# Patient Record
Sex: Female | Born: 1985 | Race: White | Hispanic: No | Marital: Married | Smoking: Never smoker
Health system: Southern US, Community
[De-identification: ages and names within clinical notes are randomized; demographics above are authoritative.]

## PROBLEM LIST (undated history)

## (undated) DIAGNOSIS — D649 Anemia, unspecified: Secondary | ICD-10-CM

## (undated) DIAGNOSIS — R112 Nausea with vomiting, unspecified: Secondary | ICD-10-CM

## (undated) DIAGNOSIS — Z9889 Other specified postprocedural states: Secondary | ICD-10-CM

## (undated) DIAGNOSIS — F419 Anxiety disorder, unspecified: Secondary | ICD-10-CM

## (undated) DIAGNOSIS — E66811 Obesity, class 1: Secondary | ICD-10-CM

## (undated) DIAGNOSIS — F32A Depression, unspecified: Secondary | ICD-10-CM

## (undated) DIAGNOSIS — T8859XA Other complications of anesthesia, initial encounter: Secondary | ICD-10-CM

## (undated) HISTORY — DX: Anxiety disorder, unspecified: F41.9

## (undated) HISTORY — PX: CHOLECYSTECTOMY: SHX55

---

## 2012-10-01 HISTORY — PX: PILONIDAL CYST EXCISION: SHX744

## 2014-10-01 HISTORY — PX: CHOLECYSTECTOMY: SHX55

## 2016-06-26 ENCOUNTER — Encounter: Payer: Self-pay | Admitting: Emergency Medicine

## 2016-06-26 ENCOUNTER — Emergency Department: Payer: BLUE CROSS/BLUE SHIELD

## 2016-06-26 ENCOUNTER — Emergency Department
Admission: EM | Admit: 2016-06-26 | Discharge: 2016-06-27 | Disposition: A | Discharge status: Home / Self Care | Payer: BLUE CROSS/BLUE SHIELD | Attending: Emergency Medicine | Admitting: Emergency Medicine

## 2016-06-26 DIAGNOSIS — R51 Headache: Secondary | ICD-10-CM | POA: Diagnosis present

## 2016-06-26 DIAGNOSIS — R110 Nausea: Secondary | ICD-10-CM

## 2016-06-26 DIAGNOSIS — G43009 Migraine without aura, not intractable, without status migrainosus: Secondary | ICD-10-CM | POA: Diagnosis not present

## 2016-06-26 DIAGNOSIS — Z7982 Long term (current) use of aspirin: Secondary | ICD-10-CM | POA: Diagnosis not present

## 2016-06-26 LAB — CBC
HEMATOCRIT: 38.4 % (ref 35.0–47.0)
HEMOGLOBIN: 13.8 g/dL (ref 12.0–16.0)
MCH: 33.1 pg (ref 26.0–34.0)
MCHC: 35.9 g/dL (ref 32.0–36.0)
MCV: 92.1 fL (ref 80.0–100.0)
PLATELETS: 196 10*3/uL (ref 150–440)
RBC: 4.17 MIL/uL (ref 3.80–5.20)
RDW: 12.1 % (ref 11.5–14.5)
WBC: 6.8 10*3/uL (ref 3.6–11.0)

## 2016-06-26 LAB — BASIC METABOLIC PANEL
Anion gap: 6 (ref 5–15)
BUN: 16 mg/dL (ref 6–20)
CHLORIDE: 104 mmol/L (ref 101–111)
CO2: 26 mmol/L (ref 22–32)
Calcium: 8.8 mg/dL — ABNORMAL LOW (ref 8.9–10.3)
Creatinine, Ser: 0.92 mg/dL (ref 0.44–1.00)
GFR calc Af Amer: >60 mL/min (ref >60)
GFR calc non Af Amer: >60 mL/min (ref >60)
GLUCOSE: 92 mg/dL (ref 65–99)
POTASSIUM: 3.8 mmol/L (ref 3.5–5.1)
Sodium: 136 mmol/L (ref 135–145)

## 2016-06-26 LAB — POCT PREGNANCY, URINE: Preg Test, Ur: NEGATIVE

## 2016-06-26 MED ORDER — IBUPROFEN 800 MG PO TABS: 800.0000 mg | ORAL_TABLET | Freq: Three times a day (TID) | ORAL | 0 refills | Status: DC | PRN

## 2016-06-26 MED ORDER — SODIUM CHLORIDE 0.9 % IV BOLUS (SEPSIS)
1000.0000 mL | Freq: Once | INTRAVENOUS | Status: AC
Start: 2016-06-26 — End: 2016-06-27
  Administered 2016-06-26: 1000 mL via INTRAVENOUS
  Filled 2016-06-26: qty 1000

## 2016-06-26 MED ORDER — KETOROLAC TROMETHAMINE 30 MG/ML IJ SOLN
30.0000 mg | Freq: Once | INTRAMUSCULAR | Status: AC
  Administered 2016-06-26: 30 mg via INTRAVENOUS
  Filled 2016-06-26: qty 1

## 2016-06-26 MED ORDER — METOCLOPRAMIDE HCL 10 MG PO TABS: 10.0000 mg | ORAL_TABLET | Freq: Three times a day (TID) | ORAL | 0 refills | Status: DC | PRN

## 2016-06-26 MED ORDER — METOCLOPRAMIDE HCL 5 MG/ML IJ SOLN
10.0000 mg | Freq: Once | INTRAMUSCULAR | Status: AC
  Administered 2016-06-26: 10 mg via INTRAVENOUS
  Filled 2016-06-26: qty 2

## 2016-06-26 MED ORDER — HYDROMORPHONE HCL 1 MG/ML IJ SOLN
0.5000 mg | Freq: Once | INTRAMUSCULAR | Status: AC
  Administered 2016-06-26: 0.5 mg via INTRAVENOUS
  Filled 2016-06-26: qty 1

## 2016-06-26 NOTE — ED Triage Notes (Signed)
Patient ambulatory to triage with steady gait, without difficulty or distress noted; pt reports frontal migraine; denies hx of as same;  seen at urgent care today and dx with ?meningitis; denies any recent illness

## 2016-06-26 NOTE — ED Provider Notes (Signed)
[facility] Provider Note  ____________________________________________  Time seen: Approximately 9:56 PM  I have reviewed the triage vital signs and the nursing notes.   HISTORY  Chief Complaint Migraine    HPI Christine Banks is a 30 y.o. female presenting with headache. The patient reports that for the last week she's had a progressively worsening headache. She describes that when she wakes up in the morning, she has an all over pressure sensation with associated nausea, decreased appetite, but no vomiting. She takes Advil or Excedrin Migraine, and her symptoms improved for several hours but then resumed. The patient has had "sweating" but no documented fever. She denies any recent trauma, tick bites, vomiting, rash, visual or speech changes, numbness tingling or weakness, travel outside the [place], difficulty walking.  Tonight she was seen at urgent care with concern for possible meningitis so was sent here for further evaluation.  FH: Positive for migraines. Negative for brain mass or aneurysms.  History reviewed. No pertinent past medical history.  There are no active problems to display for this patient.   Past Surgical History:  Procedure Laterality Date  . CHOLECYSTECTOMY      Current Outpatient Rx  . Order #: [id number] Class: Historical Med  . Order #: [id number] Class: Print  . Order #: [id number] Class: Print    Allergies Review of patient's allergies indicates no known allergies.  No family history on file.  Social History Social History  Substance Use Topics  . Smoking status: Never Smoker  . Smokeless tobacco: Never Used  . Alcohol use No    Review of Systems Constitutional: Sweating without documented fever. No lightheadedness or syncope. Eyes: No visual changes. No blurred or double vision. ENT: No sore throat. No congestion or rhinorrhea. No dental pain. Cardiovascular: Denies chest pain. Denies  palpitations. Respiratory: Denies shortness of breath.  No cough. Gastrointestinal: No abdominal pain.  Positive nausea, no vomiting.  No diarrhea.  No constipation. Positive decreased appetite. Genitourinary: Negative for dysuria. Musculoskeletal: Negative for back pain. Skin: Negative for rash. Neurological: Positive for headaches. No focal numbness, tingling or weakness. No difficulty walking. No changes in speech or vision.  10-point ROS otherwise negative.  ____________________________________________   PHYSICAL EXAM:  VITAL SIGNS: ED Triage Vitals  Enc Vitals Group     BP 06/26/16 2125 (!) 129/99     Pulse Rate 06/26/16 2125 92     Resp 06/26/16 2125 20     Temp 06/26/16 2125 98.3 F (36.8 C)     Temp Source 06/26/16 2125 Oral     SpO2 06/26/16 2125 100 %     Weight 06/26/16 2122 203 lb (92.1 kg)     Height 06/26/16 2122 5\' 6"  (1.676 m)     Head Circumference --      Peak Flow --      Pain Score 06/26/16 2122 9     Pain Loc --      Pain Edu? --      Excl. in GC? --     Constitutional: Alert and oriented. Mildly uncomfortable appearing but nontoxic. Answers questions appropriately. Eyes: Conjunctivae are normal.  EOMI. PERRLA. No photophobia. No scleral icterus. Head: Atraumatic. No pain over the temples bilaterally Nose: No congestion/rhinnorhea. Mouth/Throat: Mucous membranes are moist.  Neck: No stridor.  Supple.  No JVD. No meningismus. Full range of motion without pain. Cardiovascular: Normal rate, regular rhythm. No murmurs, rubs or gallops.  Respiratory: Normal respiratory effort.  No accessory muscle use or retractions.  Lungs CTAB.  No wheezes, rales or ronchi. Gastrointestinal: Soft, nontender and nondistended.  No guarding or rebound.  No peritoneal signs. Musculoskeletal: No LE edema. . Neurologic:  A&Ox3.  Speech is clear.  Face and smile are symmetric.  EOMI. PERRLA. Moves all extremities well. Skin:  Skin is warm, dry and intact. No rash  noted. Psychiatric: Mood and affect are normal. Speech and behavior are normal.  Normal judgement.  ____________________________________________   LABS (all labs ordered are listed, but only abnormal results are displayed)  Labs Reviewed  BASIC METABOLIC PANEL - Abnormal; Notable for the following:       Result Value   Calcium 8.8 (*)    All other components within normal limits  CBC  POC URINE PREG, ED  POCT PREGNANCY, URINE   ____________________________________________  EKG  Not indicated ____________________________________________  RADIOLOGY  No results found.  ____________________________________________   PROCEDURES  Procedure(s) performed: None  Procedures  Critical Care performed: No ____________________________________________   INITIAL IMPRESSION / ASSESSMENT AND PLAN / ED COURSE  Pertinent labs & imaging results that were available during my care of the patient were reviewed by me and considered in my medical decision making (see chart for details).  30 y.o. female with 1 week of headache with associated nausea but no other neurologic symptoms, no documented fever. Overall, the patient is well-appearing and nontoxic. While viral meningitis is possible, bacterial meningitis is very unlikely given the patient's length of symptoms, and her reassuring examination. He is also possible that she has a migraine, or dehydration. We will check her for pregnancy or UTI. Evaluate for electrolyte abnormalities. Venous sinus thrombosis is also an unlikely diagnosis in this patient, although it is possible given her age. We will start by getting a CT scan, basic labs, and initiated symptomatically treatment. I've had a discussion with the patient about lumbar puncture to evaluate for meningitis. At this time she is deferring additional intervention regarding the lumbar puncture, but we will reassess this after her initial diagnostic and therapeutic treatment is  initiated.   ----------------------------------------- 11:19 PM on 06/26/2016 -----------------------------------------  The patient's labs are reassuring, and her white blood cell count is normal. At this time, the patient states that her pain is virtually gone. She has no nausea. She is requesting food. She remains afebrile and non-meningitic on exam, so will plan to defer LP per the patient requested this time. Plan discharge. The patient understands return precautions as well as follow-up instructions. ____________________________________________  FINAL CLINICAL IMPRESSION(S) / ED DIAGNOSES  Final diagnoses:  Migraine without aura and without status migrainosus, not intractable  Nausea without vomiting    Clinical Course      NEW MEDICATIONS STARTED DURING THIS VISIT:  New Prescriptions   IBUPROFEN (ADVIL,MOTRIN) 800 MG TABLET    Take 1 tablet (800 mg total) by mouth every 8 (eight) hours as needed for headache (with food).   METOCLOPRAMIDE (REGLAN) 10 MG TABLET    Take 1 tablet (10 mg total) by mouth every 8 (eight) hours as needed for nausea or vomiting.      Rockne MenghiniAnne-Caroline Daemion Mcniel, MD 06/26/16 [phone]

## 2016-06-26 NOTE — ED Notes (Signed)
MD at bedside to discuss plan of care. Pt asking for something to eat.

## 2016-06-26 NOTE — ED Notes (Signed)
POC urine preg NEGATIVE. 

## 2016-06-26 NOTE — Discharge Instructions (Signed)
Please drink plenty of fluid to stay well-hydrated. If you begin to develop a headache, please take medications early in the course of your headache as it is easier to treat a headache early on and when it is full-blown.  Return to the emergency department if you develop severe pain, fever, numbness tingling or weakness, rash, visual changes, or any other symptoms concerning to you.

## 2016-06-27 NOTE — ED Notes (Signed)
Pt given crackers and peanut butter per her request. Pt alert and calm at this time with family at the bedside. Lights out to enhance rest. Will continue to assess.

## 2016-06-27 NOTE — ED Provider Notes (Signed)
-----------------------------------------   2:39 AM on 06/27/2016 -----------------------------------------   Blood pressure 110/62, pulse 73, temperature 98.3 F (36.8 C), temperature source Oral, resp. rate 16, height 5\' 6"  (1.676 m), weight 203 lb (92.1 kg), last menstrual period 06/04/2016, SpO2 97 %.  Assuming care from Dr. Sharma CovertNorman.  In short, Christine Banks is a 30 y.o. female with a chief complaint of Migraine .  Refer to the original H&P for additional details.  The current plan of care is to follow up the CT head results.   CT head: No acute intracranial pathology.  The patient will be discharged to home to follow-up with her primary care physician.   Christine ApleyAllison P Lyssa Hackley, MD 06/27/16 [phone]

## 2017-05-04 IMAGING — CT CT HEAD W/O CM
3 series · 16 of 44 positions shown, 19 images · non-contrast
Comparison: None.

CLINICAL DATA: 30-year-old female with headache

EXAM:
CT HEAD WITHOUT CONTRAST
TECHNIQUE: Contiguous axial images were obtained from the base of the skull
through the vertex without intravenous contrast.

[Series 3: head wo · axial · 0.42mm/px · z∈[-96,+14]mm · 10 of 27 slices shown, 13 images]
[im 3/27  brain]
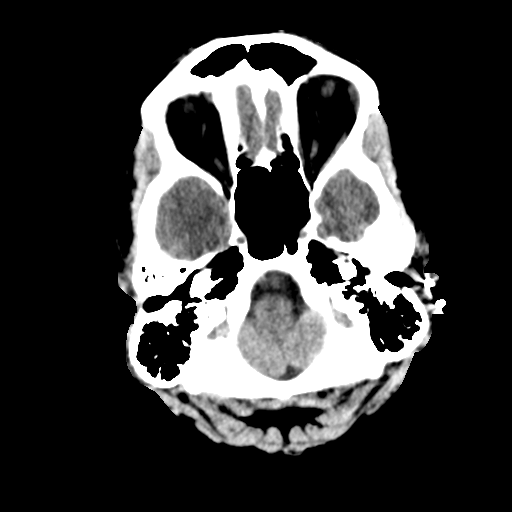
[im 3/27  bone]
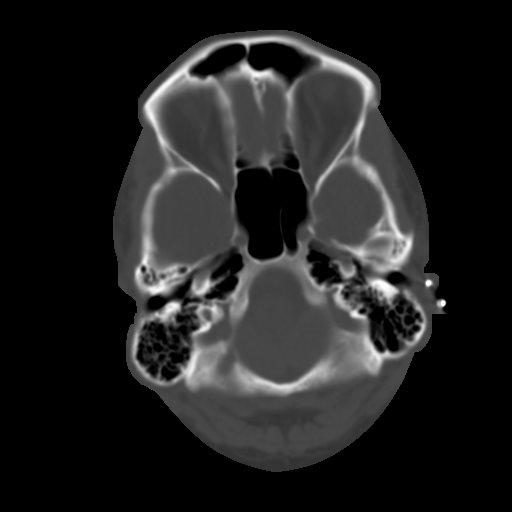
[im 5/27  brain]
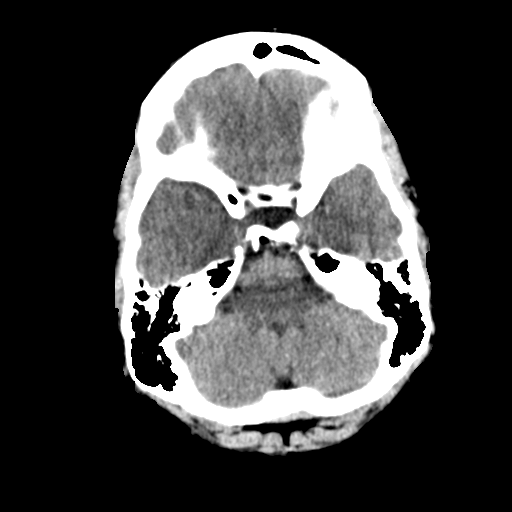
[im 8/27  brain]
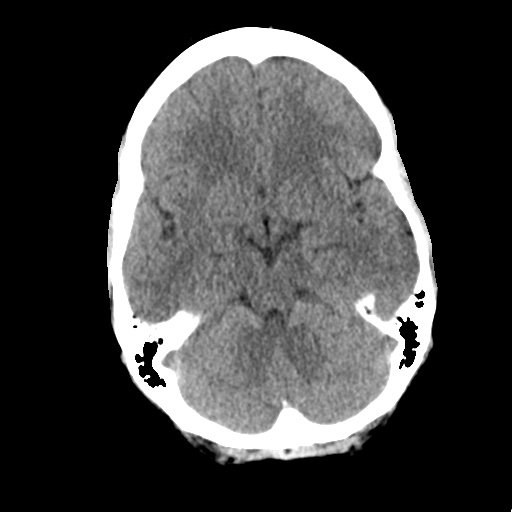
[im 10/27  brain]
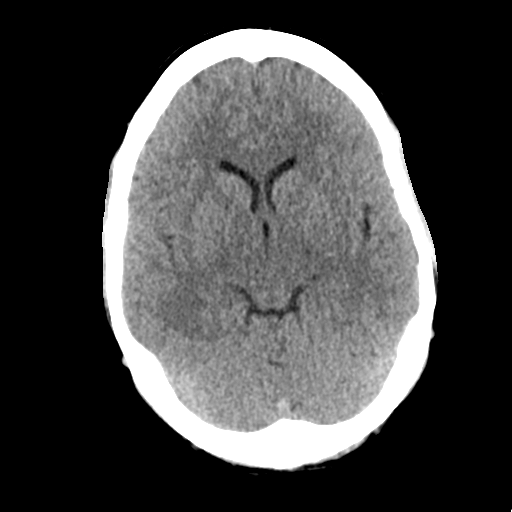
[im 13/27  brain]
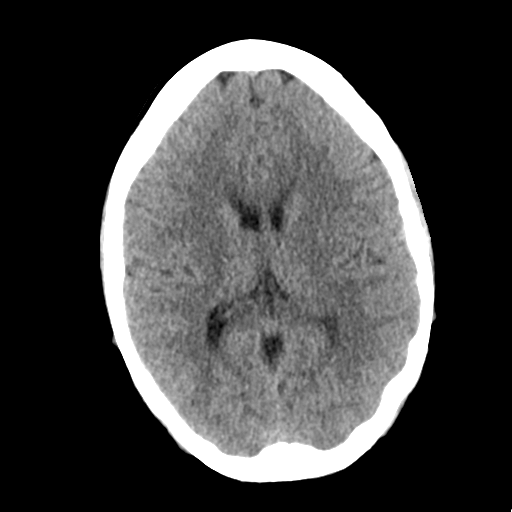
[im 13/27  bone]
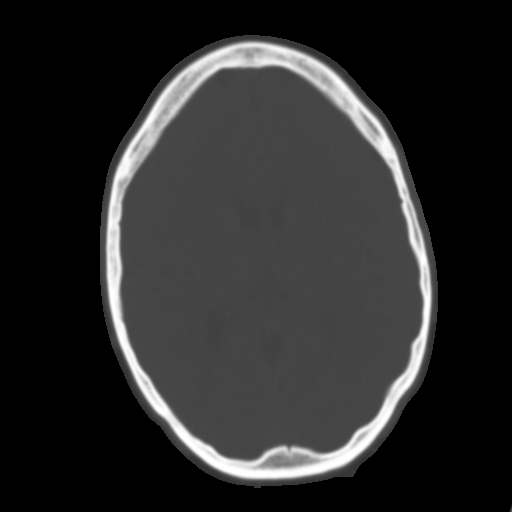
[im 15/27  brain]
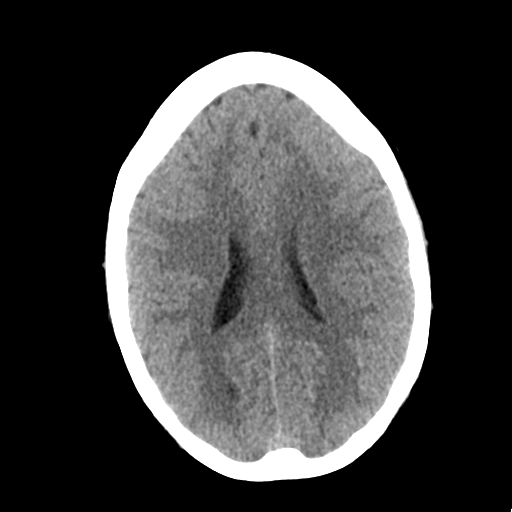
[im 18/27  brain]
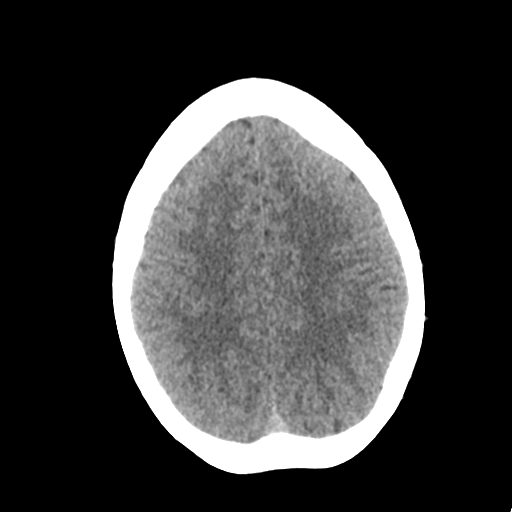
[im 20/27  brain]
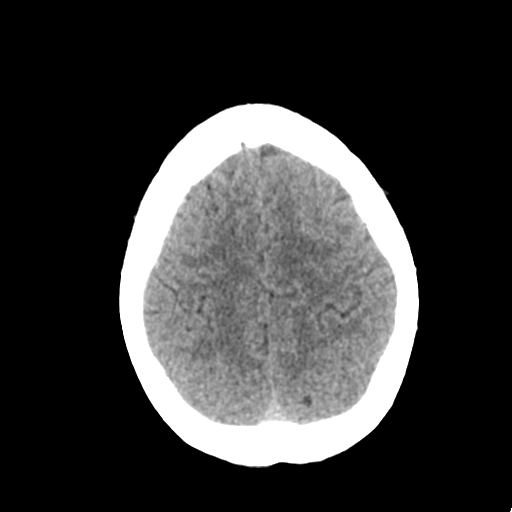
[im 23/27  brain]
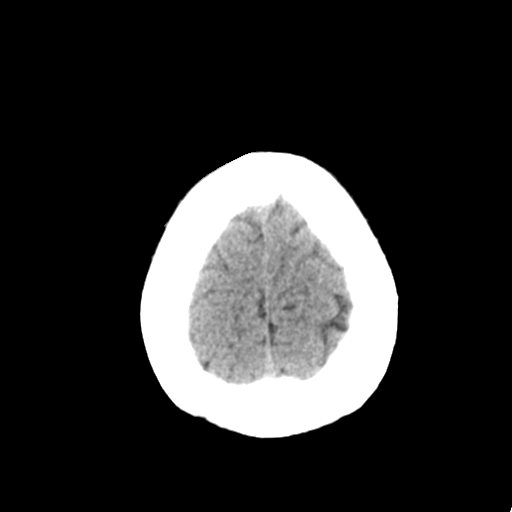
[im 23/27  bone]
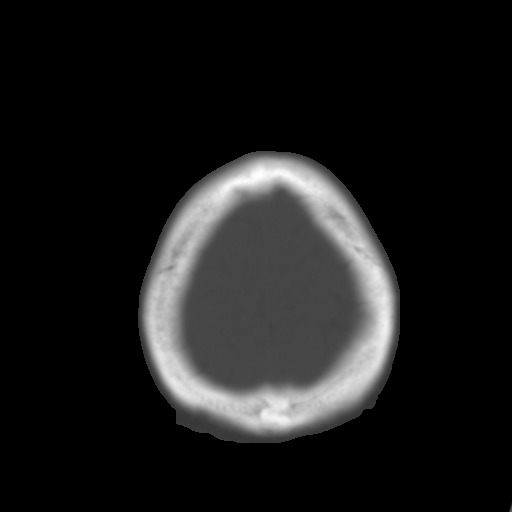
[im 25/27  brain]
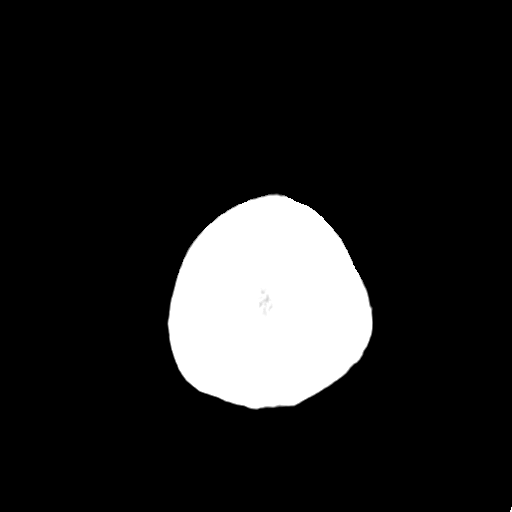

[Series 4: coronal soft tissue · coronal · 0.28mm/px · 3 of 63 slices shown]
[im 21/63  brain]
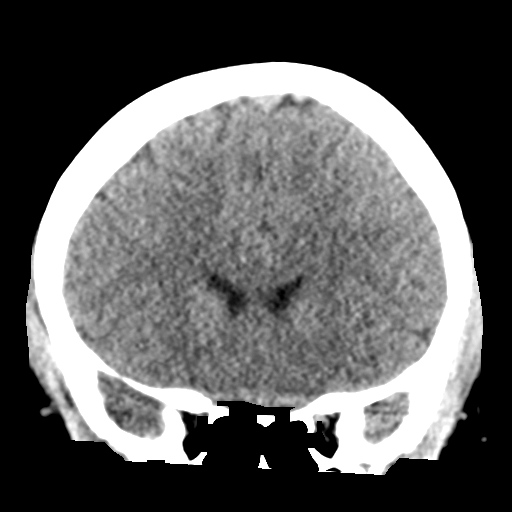
[im 28/63  brain]
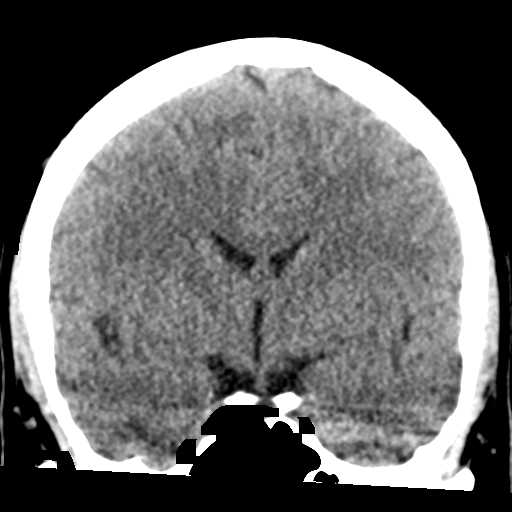
[im 35/63  brain]
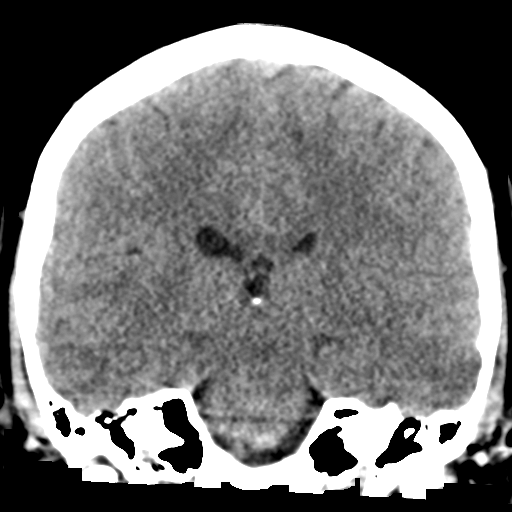

[Series 5: sagittal soft tissue · sagittal · 0.28mm/px · 3 of 46 slices shown]
[im 16/46  brain]
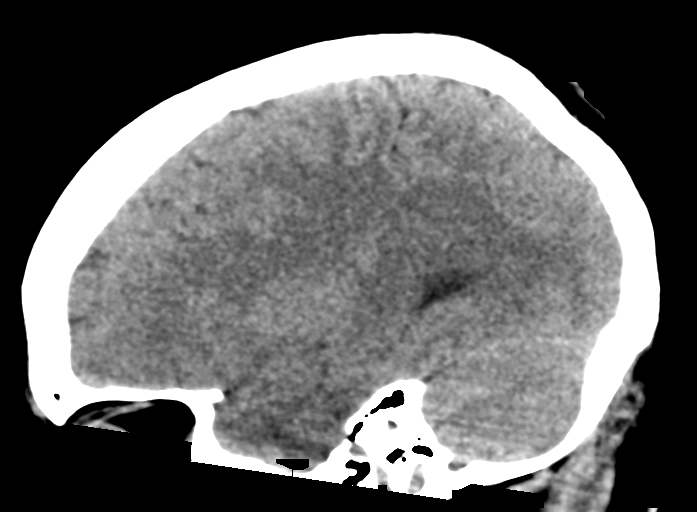
[im 23/46  brain]
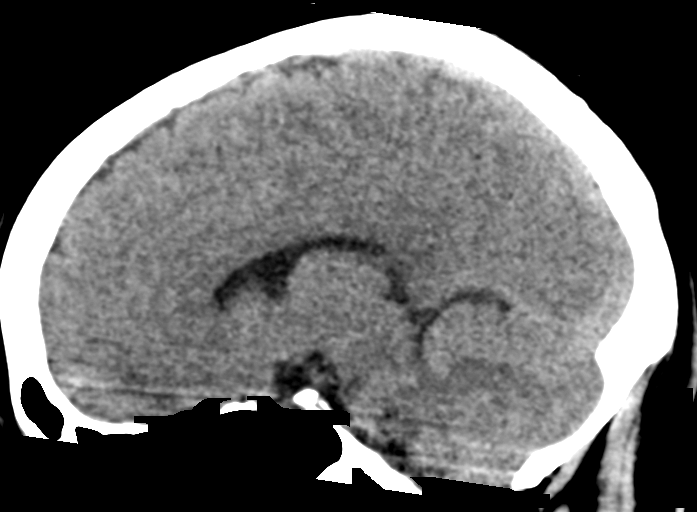
[im 31/46  brain]
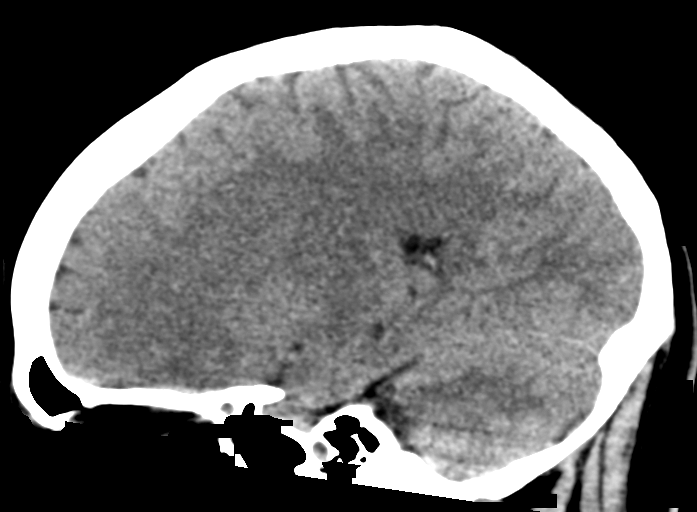

[16 of 44 positions shown; findings below may reference images not displayed]

FINDINGS: Brain: No evidence of acute infarction, hemorrhage, hydrocephalus,
extra-axial collection or mass lesion/mass effect.

Vascular: No hyperdense vessel or unexpected calcification.

Skull: Normal. Negative for fracture or focal lesion.

Sinuses/Orbits: No acute finding.

Other: None
IMPRESSION: No acute intracranial pathology.

## 2019-07-05 ENCOUNTER — Emergency Department: Payer: 59

## 2019-07-05 ENCOUNTER — Emergency Department
Admission: EM | Admit: 2019-07-05 | Discharge: 2019-07-05 | Disposition: A | Discharge status: Home / Self Care | Payer: 59 | Attending: Emergency Medicine | Admitting: Emergency Medicine

## 2019-07-05 DIAGNOSIS — S161XXA Strain of muscle, fascia and tendon at neck level, initial encounter: Secondary | ICD-10-CM | POA: Insufficient documentation

## 2019-07-05 MED ORDER — IBUPROFEN 600 MG PO TABS
600.0000 mg | ORAL_TABLET | Freq: Three times a day (TID) | ORAL | 0 refills | Status: DC | PRN
Start: 2019-07-05 — End: 2019-07-05

## 2019-07-05 MED ORDER — IBUPROFEN 600 MG PO TABS
600.00 mg | ORAL_TABLET | Freq: Once | ORAL | Status: AC
Start: 2019-07-05 — End: 2019-07-05
  Administered 2019-07-05: 18:00:00 600 mg via ORAL

## 2019-07-05 MED ORDER — IBUPROFEN 600 MG PO TABS
ORAL_TABLET | ORAL | Status: AC
Start: 2019-07-05 — End: ?
  Filled 2019-07-05: qty 1

## 2019-07-05 MED ORDER — IBUPROFEN 600 MG PO TABS
600.00 mg | ORAL_TABLET | Freq: Three times a day (TID) | ORAL | 0 refills | Status: AC | PRN
Start: 2019-07-05 — End: ?
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
## 2019-07-05 NOTE — Discharge Instructions (Signed)
Neck Sprain or Strain  A sudden force that causes turning or bending of the neck can cause sprain or strain. An example would be the force from a car accident. This can stretch or tear muscles called a strain. It can also stretch or tear ligaments called a sprain. Either of these can cause neck pain. Sometimes neck pain occurs after a simple awkward movement. In either case, muscle spasm is commonly present and contributes to the pain.  Unless you had a forceful physical injury (for example, a car accident or fall), X-rays are often not ordered for the initial evaluation of neck pain. If pain continues and does not respond to medical treatment, X-rays and other tests may be done later.  Home care   You may feel more soreness and spasm the first few days after the injury. Rest until symptoms start to improve.   When lying down, use a comfortable pillow or a rolled towel that supports the head and keeps the spine in a neutral position. The position of the head should not be tilted forward or backward.   Apply an ice pack over the injured area for 15 to 20 minutes every 3 to 6 hours. Do this for the first 24 to 48 hours. You can make an ice pack by filling a plastic bag that seals at the top with ice cubes and then wrapping it with a thin towel. After 48 hours, apply heat (warm shower or warm bath) for 15 to 20 minutes several times a day, or alternate ice and heat.   You may use over-the-counter pain medicine to control pain, unless another pain medicine was prescribed. If you have chronic liver or kidney disease or ever had a stomach ulcer or gastrointestinal bleeding, talk with your healthcare provider before using these medicines.   If a soft cervical collar was prescribed, only ear it for periods of increased pain. It should not be worn for more than 3 hours a day, or for longer than 1 to 2 weeks.  Follow-up care  Follow up with your healthcare provider, or as directed. Physical therapy may be  needed.  Sometimes fractures don't show up on the first X-ray. Bruises and sprains can sometimes hurt as much as a fracture. These injuries can take time to heal completely. If your symptoms don't improve or they get worse, talk with your healthcare provider. You may need a repeat X-ray or other tests. If X-rays were taken, you will be told of any new findings that may affect your care.  Call 911  Call 911 if you have:   Neck swelling, difficulty or painful swallowing   Trouble breathing   Chest pain  When to seek medical advice  Call your healthcare provider right away if any of these occur:   Pain becomes worse or spreads into your arms or legs   Weakness or numbness in one or both arms or legs  [employer] last reviewed this educational content on 01/29/2017   2000-2020 [employer] [address], [place] [address]. All rights reserved. This information is not intended as a substitute for professional medical care. Always follow your healthcare professional's instructions.        Neck Sprain or Strain  A sudden force that causes turning or bending of the neck can cause sprain or strain. An example would be the force from a car accident. This can stretch or tear muscles called a strain. It can also stretch or tear ligaments called   a sprain. Either of these can cause neck pain. Sometimes neck pain occurs after a simple awkward movement. In either case, muscle spasm is commonly present and contributes to the pain.  Unless you had a forceful physical injury (for example, a car accident or fall), X-rays are often not ordered for the initial evaluation of neck pain. If pain continues and does not respond to medical treatment, X-rays and other tests may be done later.  Home care   You may feel more soreness and spasm the first few days after the injury. Rest until symptoms start to improve.   When lying down, use a comfortable pillow or a rolled towel that supports the head and keeps the spine  in a neutral position. The position of the head should not be tilted forward or backward.   Apply an ice pack over the injured area for 15 to 20 minutes every 3 to 6 hours. Do this for the first 24 to 48 hours. You can make an ice pack by filling a plastic bag that seals at the top with ice cubes and then wrapping it with a thin towel. After 48 hours, apply heat (warm shower or warm bath) for 15 to 20 minutes several times a day, or alternate ice and heat.   You may use over-the-counter pain medicine to control pain, unless another pain medicine was prescribed. If you have chronic liver or kidney disease or ever had a stomach ulcer or gastrointestinal bleeding, talk with your healthcare provider before using these medicines.   If a soft cervical collar was prescribed, only ear it for periods of increased pain. It should not be worn for more than 3 hours a day, or for longer than 1 to 2 weeks.  Follow-up care  Follow up with your healthcare provider, or as directed. Physical therapy may be needed.  Sometimes fractures don't show up on the first X-ray. Bruises and sprains can sometimes hurt as much as a fracture. These injuries can take time to heal completely. If your symptoms don't improve or they get worse, talk with your healthcare provider. You may need a repeat X-ray or other tests. If X-rays were taken, you will be told of any new findings that may affect your care.  Call 911  Call 911 if you have:   Neck swelling, difficulty or painful swallowing   Trouble breathing   Chest pain  When to seek medical advice  Call your healthcare provider right away if any of these occur:   Pain becomes worse or spreads into your arms or legs   Weakness or numbness in one or both arms or legs  [employer] last reviewed this educational content on 01/29/2017   2000-2020 [employer] [address], [place] [address]. All rights reserved. This information is not intended as a substitute for professional  medical care. Always follow your healthcare professional's instructions.

## 2020-07-30 ENCOUNTER — Telehealth: Payer: BLUE CROSS/BLUE SHIELD | Admitting: Emergency Medicine

## 2020-07-30 DIAGNOSIS — J029 Acute pharyngitis, unspecified: Secondary | ICD-10-CM

## 2020-07-30 MED ORDER — CLINDAMYCIN HCL 300 MG PO CAPS: 300.0000 mg | ORAL_CAPSULE | Freq: Three times a day (TID) | ORAL | 0 refills | Status: AC

## 2020-07-30 NOTE — Progress Notes (Signed)
Time spent: 10 min  We are sorry that you are not feeling well.  Here is how we plan to help!  Based on what you have shared with me it is likely that you have pharyngitis.  Pharyngitis is inflammation and infection in the back of the throat.  Pharyngitis can be due to a virus (most common) or a bacteria (strep throat being most common).  Based on your symptoms your infection may be caused by bacteria and is treated with antibiotics.  I have prescribed Clindamycin 300 mg three times a day for 10 days. For throat pain, we recommend over the counter oral pain relief medications such as acetaminophen or ibuprofen or naproxen sodium. Topical treatments such as oral throat lozenges or sprays may be used as needed. Soft diet like mashed potatoes, smoothies will help prevent more irritation in the throat.  Warm water and salt gargles are very helpful in decreasing inflammation.  Strep infections are not as easily transmitted as other respiratory infections, however we still recommend that you avoid close contact with loved ones, especially the very young and elderly.  Remember to wash your hands thoroughly throughout the day as this is the number one way to prevent the spread of infection and wipe down door knobs and counters with disinfectant.   Home Care:  Only take medications as instructed by your medical team.  Complete the entire course of an antibiotic.  Do not take these medications with alcohol.  A steam or ultrasonic humidifier can help congestion.  You can place a towel over your head and breathe in the steam from hot water coming from a faucet.  Avoid close contacts especially the very young and the elderly.  Cover your mouth when you cough or sneeze.  Always remember to wash your hands.  Get Help Right Away If:  You develop worsening fever or sinus pain.  You develop a severe head ache or visual changes.  Your symptoms persist after you have completed your treatment plan.  Make  sure you  Understand these instructions.  Will watch your condition.  Will get help right away if you are not doing well or get worse.  Your e-visit answers were reviewed by a board certified advanced clinical practitioner to complete your personal care plan.  Depending on the condition, your plan could have included both over the counter or prescription medications.  If there is a problem please reply  once you have received a response from your provider.  Your safety is important to [place].  If you have drug allergies check your prescription carefully.    You can use MyChart to ask questions about today's visit, request a non-urgent call back, or ask for a work or school excuse for 24 hours related to this e-Visit. If it has been greater than 24 hours you will need to follow up with your provider, or enter a new e-Visit to address those concerns.  You will get an e-mail in the next two days asking about your experience.  I hope that your e-visit has been valuable and will speed your recovery. Thank you for using e-visits.

## 2022-02-12 NOTE — Progress Notes (Signed)
I,Donnielle Addison Robinson,acting as a Neurosurgeon for [employer], PA-C.,have documented all relevant documentation on the behalf of Debera Lat, PA-C,as directed by  [employer], PA-C while in the presence of [employer], PA-C.  New Patient Office Visit  Subjective    Patient ID: Christine Banks, female    DOB: 02/28/1986  Age: 36 y.o. MRN: [id number]  CC:  Chief Complaint  Patient presents with   Sinus Problem   Establish Care  CC: sore throat   HPI Christine Banks presents to establish care sore throat.    Upper respiratory symptoms She complains of congestion, cough described as productive, headache described as frontal, throbbing, nasal congestion, post nasal drip, sinus pressure, sneezing, and sore throat.with night sweats. Onset of symptoms was about a week ago and staying constant.She is drinking plenty of fluids.  Past history is significant for no history of pneumonia or bronchitis. Patient is non-smoker. Taking Mucinex daytime and nighttime with mild, temporary relief and helps patient sleep. One in the morning and one at night, occasional Tylenol.    ---------------------------------------------------------------------------------------------------  Outpatient Encounter Medications as of 02/13/2022  Medication Sig   aspirin-acetaminophen-caffeine (EXCEDRIN MIGRAINE) 250-250-65 MG tablet Take 2 tablets by mouth once. (Patient not taking: Reported on 02/13/2022)   ibuprofen (ADVIL,MOTRIN) 800 MG tablet Take 1 tablet (800 mg total) by mouth every 8 (eight) hours as needed for headache (with food). (Patient not taking: Reported on 02/13/2022)   metoCLOPramide (REGLAN) 10 MG tablet Take 1 tablet (10 mg total) by mouth every 8 (eight) hours as needed for nausea or vomiting.   No facility-administered encounter medications on file as of 02/13/2022.    Past Medical History:  Diagnosis Date   Anxiety     Past Surgical History:  Procedure Laterality Date   CHOLECYSTECTOMY      Family  History  Problem Relation Age of Onset   Hyperthyroidism Mother    Arthritis Mother    Arthritis Father    Hypertension Father    Lung cancer Maternal Grandmother    Cancer - Lung Maternal Grandfather    Lung cancer Paternal Grandfather     Social History   Socioeconomic History   Marital status: Married    Spouse name: Therapist, sports   Number of children: Not on file   Years of education: Not on file   Highest education level: Not on file  Occupational History   Not on file  Tobacco Use   Smoking status: Never   Smokeless tobacco: Never  Substance and Sexual Activity   Alcohol use: Yes   Drug use: Never   Sexual activity: Yes    Birth control/protection: Other-see comments, None    Comment: same sex marriage  Other Topics Concern   Not on file  Social History Narrative   Not on file   Social Determinants of Health   Financial Resource Strain: Not on file  Food Insecurity: Not on file  Transportation Needs: Not on file  Physical Activity: Not on file  Stress: Not on file  Social Connections: Not on file  Intimate Partner Violence: Not on file    Review of Systems  Constitutional:  Positive for malaise/fatigue.  Musculoskeletal:  Positive for neck pain.  Psychiatric/Behavioral:  The patient is nervous/anxious.   All other systems reviewed and are negative.      Objective    BP 132/90 (BP Location: Right Arm, Patient Position: Sitting, Cuff Size: Normal)   Pulse 78   Temp 98.2 F (36.8 C) (Oral)  Resp 16   Wt 199 lb (90.3 kg)   SpO2 100%   BMI 32.12 kg/m   Physical Exam Vitals reviewed.  Constitutional:      General: She is not in acute distress.    Appearance: Normal appearance. She is well-developed. She is not diaphoretic.  HENT:     Head: Normocephalic and atraumatic.     Right Ear: There is impacted cerumen.     Left Ear: There is impacted cerumen.     Nose: Nose normal.     Mouth/Throat:     Mouth: Mucous membranes are moist.     Pharynx: No  oropharyngeal exudate.  Eyes:     General: No scleral icterus.    Extraocular Movements: Extraocular movements intact.     Conjunctiva/sclera: Conjunctivae normal.     Pupils: Pupils are equal, round, and reactive to light.  Neck:     Thyroid: No thyromegaly.  Cardiovascular:     Rate and Rhythm: Normal rate and regular rhythm.     Pulses: Normal pulses.     Heart sounds: Normal heart sounds. No murmur heard. Pulmonary:     Effort: Pulmonary effort is normal. No respiratory distress.     Breath sounds: Normal breath sounds. No wheezing or rales.  Abdominal:     General: There is no distension.     Palpations: Abdomen is soft.     Tenderness: There is no abdominal tenderness.  Musculoskeletal:        General: No deformity.     Cervical back: Neck supple.     Right lower leg: No edema.     Left lower leg: No edema.  Lymphadenopathy:     Cervical: No cervical adenopathy.  Skin:    General: Skin is warm.     Findings: No rash.  Neurological:     Mental Status: She is alert and oriented to person, place, and time. Mental status is at baseline.     Sensory: No sensory deficit.     Motor: No weakness.     Gait: Gait normal.  Psychiatric:        Mood and Affect: Mood normal.        Behavior: Behavior normal.        Thought Content: Thought content normal.        Judgment: Judgment normal.        Assessment & Plan:   Problem List Items Addressed This Visit   None  1. Acute non-recurrent frontal sinusitis  - POCT rapid strep A neg - POCT Influenza A/B neg  2. Encounter for medical examination to establish care  3. Obesity (BMI 30.0-34.9) BMI 32 Initial workup Last labs 6 years ago - Comprehensive metabolic panel - Lipid panel - TSH  4. Screening for thyroid disorder - TSH  5. Nervous/anxious - will screen for anxiety/depression  6. Cerumen impaction - will remove in office or refer to ENT  7. Health maintenance - needs pap smear, mammogram? - Pt was  asked to bring immunization records to her next appt  CPE as scheduled with PAP smear if she agrees,   The patient was advised to call back or seek an in-person evaluation if the symptoms worsen or if the condition fails to improve as anticipated.  I discussed the assessment and treatment plan with the patient. The patient was provided an opportunity to ask questions and all were answered. The patient agreed with the plan and demonstrated an understanding of the instructions.  The entirety  of the information documented in the History of Present Illness, Review of Systems and Physical Exam were personally obtained by me. Portions of this information were initially documented by the CMA and reviewed by me for thoroughness and accuracy.  Portions of this note were created using dictation software and may contain typographical errors.   Sarina IllJana Robinson, CMA

## 2022-02-13 ENCOUNTER — Ambulatory Visit: Payer: Commercial Managed Care - PPO | Admitting: Physician Assistant

## 2022-02-13 ENCOUNTER — Encounter: Payer: Self-pay | Admitting: Physician Assistant

## 2022-02-13 VITALS — BP 132/90 | HR 78 | Temp 98.2°F | Resp 16 | Wt 199.0 lb

## 2022-02-13 DIAGNOSIS — E669 Obesity, unspecified: Secondary | ICD-10-CM | POA: Diagnosis not present

## 2022-02-13 DIAGNOSIS — J0100 Acute maxillary sinusitis, unspecified: Secondary | ICD-10-CM | POA: Diagnosis not present

## 2022-02-13 DIAGNOSIS — Z1329 Encounter for screening for other suspected endocrine disorder: Secondary | ICD-10-CM

## 2022-02-13 DIAGNOSIS — Z0000 Encounter for general adult medical examination without abnormal findings: Secondary | ICD-10-CM

## 2022-02-13 LAB — POCT INFLUENZA A/B

## 2022-02-13 LAB — POCT RAPID STREP A (OFFICE): Rapid Strep A Screen: NEGATIVE

## 2022-02-14 LAB — COMPREHENSIVE METABOLIC PANEL
ALT: 23 IU/L (ref 0–32)
AST: 20 IU/L (ref 0–40)
Albumin/Globulin Ratio: 1.9 (ref 1.2–2.2)
Albumin: 4.5 g/dL (ref 3.8–4.8)
Alkaline Phosphatase: 99 IU/L (ref 44–121)
BUN/Creatinine Ratio: 13 (ref 9–23)
BUN: 11 mg/dL (ref 6–20)
Bilirubin Total: 0.6 mg/dL (ref 0.0–1.2)
CO2: 27 mmol/L (ref 20–29)
Calcium: 9.6 mg/dL (ref 8.7–10.2)
Chloride: 103 mmol/L (ref 96–106)
Creatinine, Ser: 0.85 mg/dL (ref 0.57–1.00)
Globulin, Total: 2.4 g/dL (ref 1.5–4.5)
Glucose: 96 mg/dL (ref 70–99)
Potassium: 5.1 mmol/L (ref 3.5–5.2)
Sodium: 140 mmol/L (ref 134–144)
Total Protein: 6.9 g/dL (ref 6.0–8.5)
eGFR: 90 mL/min/{1.73_m2} (ref >59)

## 2022-02-14 LAB — LIPID PANEL
Chol/HDL Ratio: 2.1 ratio (ref 0.0–4.4)
Cholesterol, Total: 132 mg/dL (ref 100–199)
HDL: 63 mg/dL (ref >39)
LDL Chol Calc (NIH): 57 mg/dL (ref 0–99)
Triglycerides: 54 mg/dL (ref 0–149)
VLDL Cholesterol Cal: 12 mg/dL (ref 5–40)

## 2022-02-14 LAB — TSH: TSH: 1.32 u[IU]/mL (ref 0.450–4.500)

## 2022-02-15 DIAGNOSIS — E669 Obesity, unspecified: Secondary | ICD-10-CM | POA: Insufficient documentation

## 2022-02-15 DIAGNOSIS — E66811 Obesity, class 1: Secondary | ICD-10-CM | POA: Insufficient documentation

## 2022-02-15 DIAGNOSIS — Z0000 Encounter for general adult medical examination without abnormal findings: Secondary | ICD-10-CM | POA: Insufficient documentation

## 2022-03-20 ENCOUNTER — Ambulatory Visit (INDEPENDENT_AMBULATORY_CARE_PROVIDER_SITE_OTHER): Payer: Commercial Managed Care - PPO | Admitting: Physician Assistant

## 2022-03-20 ENCOUNTER — Other Ambulatory Visit (HOSPITAL_COMMUNITY)
Admission: RE | Admit: 2022-03-20 | Discharge: 2022-03-20 | Disposition: A | Discharge status: Home / Self Care | Payer: Commercial Managed Care - PPO | Source: Ambulatory Visit | Attending: Physician Assistant | Admitting: Physician Assistant

## 2022-03-20 ENCOUNTER — Encounter: Payer: Self-pay | Admitting: Physician Assistant

## 2022-03-20 VITALS — BP 116/69 | HR 67 | Temp 97.8°F | Resp 16 | Ht 67.0 in | Wt 201.0 lb

## 2022-03-20 DIAGNOSIS — H6123 Impacted cerumen, bilateral: Secondary | ICD-10-CM

## 2022-03-20 DIAGNOSIS — Z124 Encounter for screening for malignant neoplasm of cervix: Secondary | ICD-10-CM | POA: Insufficient documentation

## 2022-03-20 DIAGNOSIS — Z0000 Encounter for general adult medical examination without abnormal findings: Secondary | ICD-10-CM | POA: Diagnosis not present

## 2022-03-20 DIAGNOSIS — Z114 Encounter for screening for human immunodeficiency virus [HIV]: Secondary | ICD-10-CM

## 2022-03-20 DIAGNOSIS — Z1159 Encounter for screening for other viral diseases: Secondary | ICD-10-CM

## 2022-03-20 DIAGNOSIS — Z23 Encounter for immunization: Secondary | ICD-10-CM | POA: Diagnosis not present

## 2022-03-20 NOTE — Progress Notes (Signed)
I,Roshena L Chambers,acting as a Neurosurgeon for [employer], PA-C.,have documented all relevant documentation on the behalf of Debera Lat, PA-C,as directed by  [employer], PA-C while in the presence of [employer], PA-C.   Complete physical exam   Patient: Christine Banks   DOB: 08-24-1986   36 y.o. Female  MRN: [id number] Visit Date: 03/20/2022  Today's healthcare provider: Debera Lat, PA-C   Chief Complaint  Patient presents with   Annual Exam   Subjective    Christine Banks is a 36 y.o. female who presents today for a complete physical exam.  She reports consuming a general diet. Home exercise routine includes stationary bike. She generally feels fairly well. She reports sleeping fairly well. She does have additional problems to discuss today (dizziness for the past week).    Past Medical History:  Diagnosis Date   Anxiety    Past Surgical History:  Procedure Laterality Date   CHOLECYSTECTOMY     Social History   Socioeconomic History   Marital status: Married    Spouse name: Stacie   Number of children: Not on file   Years of education: Not on file   Highest education level: Not on file  Occupational History   Not on file  Tobacco Use   Smoking status: Never   Smokeless tobacco: Never  Substance and Sexual Activity   Alcohol use: Yes   Drug use: Never   Sexual activity: Yes    Birth control/protection: Other-see comments, None    Comment: same sex marriage  Other Topics Concern   Not on file  Social History Narrative   Not on file   Social Determinants of Health   Financial Resource Strain: Not on file  Food Insecurity: Not on file  Transportation Needs: Not on file  Physical Activity: Not on file  Stress: Not on file  Social Connections: Not on file  Intimate Partner Violence: Not on file   Family Status  Relation Name Status   Mother  (Not Specified)   Father  (Not Specified)   MGM  (Not Specified)   MGF  (Not Specified)   PGF  (Not  Specified)   Family History  Problem Relation Age of Onset   Hyperthyroidism Mother    Arthritis Mother    Arthritis Father    Hypertension Father    Lung cancer Maternal Grandmother    Cancer - Lung Maternal Grandfather    Lung cancer Paternal Grandfather    No Known Allergies  Patient Care Team: Debera Lat, PA-C as PCP - General (Physician Assistant)   Medications: Outpatient Medications Prior to Visit  Medication Sig   ibuprofen (ADVIL,MOTRIN) 800 MG tablet Take 1 tablet (800 mg total) by mouth every 8 (eight) hours as needed for headache (with food).   No facility-administered medications prior to visit.    Review of Systems  Constitutional:  Negative for chills, fatigue and fever.  HENT:  Positive for ear pain (right ear) and tinnitus. Negative for congestion, rhinorrhea, sneezing and sore throat.   Eyes: Negative.  Negative for pain and redness.  Respiratory:  Negative for cough, shortness of breath and wheezing.   Cardiovascular:  Negative for chest pain and leg swelling.  Gastrointestinal:  Negative for abdominal pain, blood in stool, constipation, diarrhea and nausea.  Endocrine: Negative for polydipsia and polyphagia.  Genitourinary: Negative.  Negative for dysuria, flank pain, hematuria, pelvic pain, vaginal bleeding and vaginal discharge.  Musculoskeletal:  Negative for arthralgias, back pain, gait problem  and joint swelling.  Skin:  Negative for rash.  Neurological:  Positive for dizziness and numbness (in fingers). Negative for tremors, seizures, weakness, light-headedness and headaches.  Hematological:  Negative for adenopathy.  Psychiatric/Behavioral: Negative.  Negative for behavioral problems, confusion and dysphoric mood. The patient is not nervous/anxious and is not hyperactive.       Objective     BP 116/69 (BP Location: Right Arm, Patient Position: Sitting, Cuff Size: Large)   Pulse 67   Temp 97.8 F (36.6 C) (Oral)   Resp 16   Ht 5\' 7"   (1.702 m)   Wt 201 lb (91.2 kg)   LMP 02/28/2022   SpO2 100% Comment: room air  BMI 31.48 kg/m     Physical Exam Vitals reviewed.  Constitutional:      General: She is not in acute distress.    Appearance: Normal appearance. She is well-developed. She is not diaphoretic.  HENT:     Head: Normocephalic and atraumatic.     Right Ear: Tympanic membrane, ear canal and external ear normal. There is impacted cerumen.     Left Ear: Tympanic membrane, ear canal and external ear normal. There is impacted cerumen.     Ears:     Comments: Positive finger rub hearing screening test    Nose: Nose normal.     Mouth/Throat:     Mouth: Mucous membranes are moist.     Pharynx: Oropharynx is clear. No oropharyngeal exudate.  Eyes:     General: No scleral icterus.    Conjunctiva/sclera: Conjunctivae normal.     Pupils: Pupils are equal, round, and reactive to light.  Neck:     Thyroid: No thyromegaly.  Cardiovascular:     Rate and Rhythm: Normal rate and regular rhythm.     Pulses: Normal pulses.     Heart sounds: Normal heart sounds. No murmur heard. Pulmonary:     Effort: Pulmonary effort is normal. No respiratory distress.     Breath sounds: Normal breath sounds. No wheezing or rales.  Abdominal:     General: There is no distension.     Palpations: Abdomen is soft.     Tenderness: There is no abdominal tenderness.  Musculoskeletal:        General: No deformity.     Cervical back: Neck supple.     Right lower leg: No edema.     Left lower leg: No edema.  Lymphadenopathy:     Cervical: No cervical adenopathy.  Skin:    General: Skin is warm and dry.     Findings: No rash.  Neurological:     Mental Status: She is alert and oriented to person, place, and time. Mental status is at baseline.     Sensory: No sensory deficit.     Motor: No weakness.     Gait: Gait normal.  Psychiatric:        Mood and Affect: Mood normal.        Behavior: Behavior normal.        Thought Content:  Thought content normal.      Last depression screening scores    03/20/2022   10:14 AM 02/13/2022    9:07 AM  PHQ 2/9 Scores  PHQ - 2 Score 3 2  PHQ- 9 Score 7 5   Last fall risk screening    03/20/2022   10:15 AM  Fall Risk   Falls in the past year? 0  Number falls in past yr: 0  Injury with  Fall? 0  Risk for fall due to : No Fall Risks  Follow up Falls evaluation completed   Last Audit-C alcohol use screening    02/13/2022    9:08 AM  Alcohol Use Disorder Test (AUDIT)  1. How often do you have a drink containing alcohol? 2  2. How many drinks containing alcohol do you have on a typical day when you are drinking? 0  3. How often do you have six or more drinks on one occasion? 1  AUDIT-C Score 3   A score of 3 or more in women, and 4 or more in men indicates increased risk for alcohol abuse, EXCEPT if all of the points are from question 1   No results found for any visits on 03/20/22.  Assessment & Plan    Routine Health Maintenance and Physical Exam  Exercise Activities and Dietary recommendations  Goals   Weight loss of 5 % of her current weight within 3-6 mo via diet and regular exercise      There is no immunization history on file for this patient.  Health Maintenance  Topic Date Due   COVID-19 Vaccine (1) Never done   HIV Screening  Never done   Hepatitis C Screening  Never done   TETANUS/TDAP  Never done   PAP SMEAR-Modifier  Never done   INFLUENZA VACCINE  05/01/2022   HPV VACCINES  Aged Out    Discussed health benefits of physical activity, and encouraged her to engage in regular exercise appropriate for her age and condition.  1. Encounter for hepatitis C screening test for low risk patient  - Hepatitis C Antibody  2. Encounter for screening for HIV  - HIV antibody (with reflex)  3. Annual physical exam  - CBC with Differential/Platelet  4. Morbid obesity (HCC)  - CBC with Differential/Platelet  5. Screening for cervical cancer  -  Cytology - PAP  6. Need for tetanus, diphtheria, and acellular pertussis (Tdap) vaccine in patient of adolescent age or older  - Administer Tetanus-diphtheria-acellular pertussis (Tdap) vaccine 7. Cerumen impaction Ceruminosis is noted. Referral to ENT was placed.  FU as scheduled   The patient was advised to call back or seek an in-person evaluation if the symptoms worsen or if the condition fails to improve as anticipated.  I discussed the assessment and treatment plan with the patient. The patient was provided an opportunity to ask questions and all were answered. The patient agreed with the plan and demonstrated an understanding of the instructions.  The entirety of the information documented in the History of Present Illness, Review of Systems and Physical Exam were personally obtained by me. Portions of this information were initially documented by the CMA and reviewed by me for thoroughness and accuracy.  Portions of this note were created using dictation software and may contain typographical errors.     Debera Lat, PA-C  [facility] [phone] (phone) [phone] (fax)  [facility]

## 2022-03-22 LAB — CYTOLOGY - PAP
Comment: NEGATIVE
Diagnosis: UNDETERMINED — AB

## 2022-03-22 NOTE — Progress Notes (Signed)
Hello Christine Banks ,   Your pap smear / HPV results are back You have an ASCUS pap test but you tested negative for HPV, you are not likely to have cervical precancer, and you should have repeat HPV testing with or without a Pap test in three years. In most cases, the ASC-US will resolve during this time.   Or you could repeat Pap testing in one year - This is also an acceptable option. If this test is normal, you can return to regular screening. If an abnormality is found, then you will need to have a colposcopy.  Let me know how would like to proceed at your next fu appt.  Any questions please reach out to the office or message me on MyChart!  Best, Debera Lat, PA-C

## 2022-05-08 ENCOUNTER — Ambulatory Visit: Payer: Commercial Managed Care - PPO | Admitting: Physician Assistant

## 2022-05-08 ENCOUNTER — Encounter: Payer: Self-pay | Admitting: Physician Assistant

## 2022-05-08 VITALS — BP 119/79 | HR 69 | Temp 98.0°F | Resp 16 | Wt 208.8 lb

## 2022-05-08 DIAGNOSIS — M549 Dorsalgia, unspecified: Secondary | ICD-10-CM

## 2022-05-08 DIAGNOSIS — R200 Anesthesia of skin: Secondary | ICD-10-CM

## 2022-05-08 DIAGNOSIS — M545 Low back pain, unspecified: Secondary | ICD-10-CM

## 2022-05-08 DIAGNOSIS — R202 Paresthesia of skin: Secondary | ICD-10-CM

## 2022-05-08 DIAGNOSIS — M542 Cervicalgia: Secondary | ICD-10-CM | POA: Diagnosis not present

## 2022-05-08 DIAGNOSIS — M792 Neuralgia and neuritis, unspecified: Secondary | ICD-10-CM | POA: Diagnosis not present

## 2022-05-08 DIAGNOSIS — M5440 Lumbago with sciatica, unspecified side: Secondary | ICD-10-CM

## 2022-05-08 MED ORDER — GABAPENTIN 100 MG PO CAPS: 100.0000 mg | ORAL_CAPSULE | Freq: Three times a day (TID) | ORAL | 3 refills | Status: DC
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
## 2022-05-10 ENCOUNTER — Other Ambulatory Visit: Payer: Self-pay | Admitting: Physician Assistant

## 2022-05-10 DIAGNOSIS — M5440 Lumbago with sciatica, unspecified side: Secondary | ICD-10-CM

## 2022-05-10 DIAGNOSIS — R200 Anesthesia of skin: Secondary | ICD-10-CM

## 2022-05-10 DIAGNOSIS — M542 Cervicalgia: Secondary | ICD-10-CM

## 2022-05-10 DIAGNOSIS — M549 Dorsalgia, unspecified: Secondary | ICD-10-CM | POA: Insufficient documentation

## 2022-05-10 DIAGNOSIS — M545 Low back pain, unspecified: Secondary | ICD-10-CM | POA: Insufficient documentation

## 2022-05-10 DIAGNOSIS — M792 Neuralgia and neuritis, unspecified: Secondary | ICD-10-CM

## 2022-05-10 MED ORDER — GABAPENTIN 100 MG PO CAPS: 100.0000 mg | ORAL_CAPSULE | Freq: Three times a day (TID) | ORAL | 3 refills | Status: DC

## 2022-05-10 NOTE — Telephone Encounter (Signed)
Sent to wrong pharmacy, resent to preferred pharmacy. Requested Prescriptions  Pending Prescriptions Disp Refills  . gabapentin (NEURONTIN) 100 MG capsule 90 capsule 3    Sig: Take 1 capsule (100 mg total) by mouth 3 (three) times daily.     Neurology: Anticonvulsants - gabapentin Passed - 05/10/2022  3:10 PM      Passed - Cr in normal range and within 360 days    Creatinine, Ser  Date Value Ref Range Status  02/13/2022 0.85 0.57 - 1.00 mg/dL Final         Passed - Completed PHQ-2 or PHQ-9 in the last 360 days      Passed - Valid encounter within last 12 months    Recent Outpatient Visits          2 days ago Nerve pain   [facility] [place], [place], PA-C   1 month ago Encounter for hepatitis C screening test for low risk patient   [facility] [place], [place], PA-C   2 months ago Acute non-recurrent maxillary sinusitis   [facility], [place], PA-C      Future Appointments            In 4 weeks [place], Edmon Crape, PA-C [facility]

## 2022-05-10 NOTE — Telephone Encounter (Signed)
Pt states gabapentin (NEURONTIN) 100 MG capsule was sent in to incorrect pharmacy Please send to [facility] Address: [address], [place], [place] [address] Phone: [phone]

## 2022-05-11 ENCOUNTER — Ambulatory Visit
Admission: RE | Admit: 2022-05-11 | Discharge: 2022-05-11 | Disposition: A | Discharge status: Home / Self Care | Payer: Commercial Managed Care - PPO | Source: Ambulatory Visit | Attending: Physician Assistant | Admitting: Physician Assistant

## 2022-05-11 ENCOUNTER — Ambulatory Visit
Admission: RE | Admit: 2022-05-11 | Discharge: 2022-05-11 | Disposition: A | Discharge status: Home / Self Care | Payer: Commercial Managed Care - PPO | Attending: Physician Assistant | Admitting: Physician Assistant

## 2022-05-11 DIAGNOSIS — R202 Paresthesia of skin: Secondary | ICD-10-CM | POA: Insufficient documentation

## 2022-05-11 DIAGNOSIS — M542 Cervicalgia: Secondary | ICD-10-CM | POA: Insufficient documentation

## 2022-05-11 DIAGNOSIS — M549 Dorsalgia, unspecified: Secondary | ICD-10-CM

## 2022-05-11 DIAGNOSIS — R200 Anesthesia of skin: Secondary | ICD-10-CM | POA: Insufficient documentation

## 2022-05-11 DIAGNOSIS — M792 Neuralgia and neuritis, unspecified: Secondary | ICD-10-CM | POA: Diagnosis present

## 2022-05-13 NOTE — Progress Notes (Signed)
Hello Christine Banks ,   Your CXR results all are within normal limits.  We will discuss your options after your appt with Neurology and PT. Any questions please reach out to the office or message me on MyChart!  Best, Debera Lat, PA-C

## 2022-05-17 ENCOUNTER — Ambulatory Visit: Payer: Commercial Managed Care - PPO | Admitting: Physician Assistant

## 2022-06-07 ENCOUNTER — Ambulatory Visit: Payer: Commercial Managed Care - PPO | Admitting: Physician Assistant

## 2022-06-19 ENCOUNTER — Ambulatory Visit: Payer: Commercial Managed Care - PPO | Admitting: Physician Assistant

## 2022-06-20 ENCOUNTER — Encounter: Payer: Self-pay | Admitting: Physician Assistant

## 2022-06-20 ENCOUNTER — Ambulatory Visit: Payer: Commercial Managed Care - PPO | Admitting: Physician Assistant

## 2022-06-20 VITALS — BP 108/60 | HR 63 | Temp 98.4°F | Resp 16 | Ht 67.0 in | Wt 213.0 lb

## 2022-06-20 DIAGNOSIS — R202 Paresthesia of skin: Secondary | ICD-10-CM

## 2022-06-20 DIAGNOSIS — M545 Low back pain, unspecified: Secondary | ICD-10-CM | POA: Diagnosis not present

## 2022-06-20 DIAGNOSIS — M792 Neuralgia and neuritis, unspecified: Secondary | ICD-10-CM

## 2022-06-20 DIAGNOSIS — M549 Dorsalgia, unspecified: Secondary | ICD-10-CM

## 2022-06-20 DIAGNOSIS — R200 Anesthesia of skin: Secondary | ICD-10-CM

## 2022-06-20 DIAGNOSIS — Z1239 Encounter for other screening for malignant neoplasm of breast: Secondary | ICD-10-CM

## 2022-06-20 DIAGNOSIS — M542 Cervicalgia: Secondary | ICD-10-CM | POA: Diagnosis not present

## 2022-06-20 NOTE — Progress Notes (Signed)
I,Roshena L Chambers,acting as a Education administrator for [employer], PA-C.,have documented all relevant documentation on the behalf of Mardene Speak, PA-C,as directed by  [employer], PA-C while in the presence of [employer], PA-C.   Established patient visit   Patient: Christine Banks   DOB: [birth year]   36 y.o. Female  MRN: [id number] Visit Date: 06/20/2022  Today's healthcare provider: Mardene Speak, PA-C   Chief Complaint  Patient presents with   Neck Pain   Obesity   Subjective    HPI  Follow up for neck pain:  The patient was last seen for this on 05/08/2022.  Management during that visit includes prescribing Gabapentin. Referral order for neurology and PT was also placed.   She reports good compliance with treatment. She feels that condition is slightly Improved. She has been taking Gabapentin at bedtime. She has not seen Neurology or PT. She is not having side effects.   -----------------------------------------------------------------------------------------   Obesity: Patient would like to discuss options to help with weight loss.   Pt concerns about enlarged lymph nodes in the left axilla on self-exam. Some time ago , there was an enlarged node in the right axilla.  Medications: Outpatient Medications Prior to Visit  Medication Sig   gabapentin (NEURONTIN) 100 MG capsule Take 1 capsule (100 mg total) by mouth 3 (three) times daily.   ibuprofen (ADVIL,MOTRIN) 800 MG tablet Take 1 tablet (800 mg total) by mouth every 8 (eight) hours as needed for headache (with food).   No facility-administered medications prior to visit.    Review of Systems  Constitutional:  Negative for appetite change, chills, fatigue and fever.  Respiratory:  Negative for chest tightness and shortness of breath.   Cardiovascular:  Negative for chest pain and palpitations.  Gastrointestinal:  Negative for abdominal pain, nausea and vomiting.  Musculoskeletal:  Positive for neck pain.   Neurological:  Negative for dizziness and weakness.       Objective    BP 108/60 (BP Location: Right Arm, Patient Position: Sitting, Cuff Size: Large)   Pulse 63   Temp 98.4 F (36.9 C) (Oral)   Resp 16   Ht 5\' 7"  (1.702 m)   Wt 213 lb (96.6 kg)   LMP 06/11/2022 (Exact Date)   SpO2 99% Comment: room air  BMI 33.36 kg/m    Physical Exam Vitals reviewed.  Constitutional:      General: She is not in acute distress.    Appearance: Normal appearance. She is well-developed. She is not diaphoretic.  HENT:     Head: Normocephalic and atraumatic.  Eyes:     General: No scleral icterus.    Conjunctiva/sclera: Conjunctivae normal.  Neck:     Thyroid: No thyromegaly.  Cardiovascular:     Rate and Rhythm: Normal rate and regular rhythm.     Pulses: Normal pulses.     Heart sounds: Normal heart sounds. No murmur heard. Pulmonary:     Effort: Pulmonary effort is normal. No respiratory distress.     Breath sounds: Normal breath sounds. No wheezing, rhonchi or rales.  Chest:  Breasts:    Breasts are symmetrical.     Right: Tenderness (outer quadrants and lower /6-3 o'clock) present. No swelling, bleeding, inverted nipple, nipple discharge or skin change.     Left: No swelling, bleeding, inverted nipple, nipple discharge, skin change or tenderness.  Musculoskeletal:     Cervical back: Neck supple.     Right lower leg: No edema.  Left lower leg: No edema.  Lymphadenopathy:     Cervical: No cervical adenopathy.     Upper Body:     Right upper body: No supraclavicular or axillary adenopathy.     Left upper body: Axillary adenopathy (not palpable, tender area) present. No supraclavicular adenopathy.  Skin:    General: Skin is warm and dry.     Findings: No rash.  Neurological:     Mental Status: She is alert and oriented to person, place, and time. Mental status is at baseline.  Psychiatric:        Mood and Affect: Mood normal.        Behavior: Behavior normal.       No  results found for any visits on 06/20/22.  Assessment & Plan     1. Neck pain 2. Nerve pain 3. Bilateral low back pain, unspecified chronicity, unspecified whether sciatica present Continue to take gabapentin Pt was advised to contact Neurology and PT for an appt if needed  4. Upper back pain XR thoracic spine showed no acute abnormalities.  5. Numbness and tingling Improved Median nv distribution/foream and hand/L> R Advised contrast baths for hands x 20', wear Braces at night - gabapentin  6. Screening breast examination Normal breast exam at office Continue with self-breast exam Fu in 1-2 weeks if pain persists we might need to proceed with diagnostic mammogram   FU as scheduled  The patient was advised to call back or seek an in-person evaluation if the symptoms worsen or if the condition fails to improve as anticipated.  I discussed the assessment and treatment plan with the patient. The patient was provided an opportunity to ask questions and all were answered. The patient agreed with the plan and demonstrated an understanding of the instructions.  The entirety of the information documented in the History of Present Illness, Review of Systems and Physical Exam were personally obtained by me. Portions of this information were initially documented by the CMA and reviewed by me for thoroughness and accuracy.  Portions of this note were created using dictation software and may contain typographical errors.      Debera Lat, PA-C  [facility] [phone] (phone) [phone] (fax)  [facility]

## 2022-07-10 ENCOUNTER — Other Ambulatory Visit: Payer: Self-pay | Admitting: Physician Assistant

## 2022-07-10 DIAGNOSIS — G8929 Other chronic pain: Secondary | ICD-10-CM

## 2022-07-10 DIAGNOSIS — R202 Paresthesia of skin: Secondary | ICD-10-CM

## 2022-07-10 DIAGNOSIS — M542 Cervicalgia: Secondary | ICD-10-CM

## 2022-07-10 DIAGNOSIS — R200 Anesthesia of skin: Secondary | ICD-10-CM

## 2022-07-27 ENCOUNTER — Ambulatory Visit
Admission: RE | Admit: 2022-07-27 | Discharge: 2022-07-27 | Disposition: A | Discharge status: Home / Self Care | Payer: Commercial Managed Care - PPO | Source: Ambulatory Visit | Attending: Physician Assistant | Admitting: Physician Assistant

## 2022-07-27 DIAGNOSIS — R202 Paresthesia of skin: Secondary | ICD-10-CM

## 2022-07-27 DIAGNOSIS — M542 Cervicalgia: Secondary | ICD-10-CM

## 2022-07-27 DIAGNOSIS — G8929 Other chronic pain: Secondary | ICD-10-CM

## 2022-07-27 DIAGNOSIS — R200 Anesthesia of skin: Secondary | ICD-10-CM

## 2022-08-07 ENCOUNTER — Ambulatory Visit: Payer: Commercial Managed Care - PPO | Admitting: Physician Assistant

## 2022-08-27 ENCOUNTER — Telehealth: Payer: Commercial Managed Care - PPO | Admitting: Physician Assistant

## 2022-08-27 DIAGNOSIS — R3989 Other symptoms and signs involving the genitourinary system: Secondary | ICD-10-CM

## 2022-08-28 MED ORDER — CEPHALEXIN 500 MG PO CAPS: 500.0000 mg | ORAL_CAPSULE | Freq: Two times a day (BID) | ORAL | 0 refills | Status: AC

## 2022-08-28 NOTE — Progress Notes (Signed)
I have spent 5 minutes in review of e-visit questionnaire, review and updating patient chart, medical decision making and response to patient.   Mersedes Alber Cody [place], PA-C    

## 2022-08-28 NOTE — Progress Notes (Signed)

## 2023-01-11 ENCOUNTER — Encounter: Payer: Self-pay | Admitting: Physician Assistant

## 2023-01-11 ENCOUNTER — Ambulatory Visit: Payer: Commercial Managed Care - PPO | Admitting: Physician Assistant

## 2023-01-11 VITALS — BP 112/73 | HR 62 | Temp 98.0°F | Resp 16 | Wt 216.0 lb

## 2023-01-11 DIAGNOSIS — F419 Anxiety disorder, unspecified: Secondary | ICD-10-CM

## 2023-01-11 DIAGNOSIS — R002 Palpitations: Secondary | ICD-10-CM

## 2023-01-11 DIAGNOSIS — R0789 Other chest pain: Secondary | ICD-10-CM

## 2023-01-11 MED ORDER — CITALOPRAM HYDROBROMIDE 10 MG PO TABS
ORAL_TABLET | ORAL | 0 refills | Status: DC
Start: 2023-01-11 — End: 2023-02-26

## 2023-01-11 NOTE — Progress Notes (Signed)
   I,Sulibeya S Dimas,acting as a Neurosurgeon for [employer], PA-C.,have documented all relevant documentation on the behalf of Debera Lat, PA-C,as directed by  [employer], PA-C while in the presence of [employer], PA-C.     Established patient visit   Patient: Christine Banks   DOB: 15-Mar-1986   37 y.o. Female  MRN: [id number] Visit Date: 01/11/2023  Today's healthcare provider: Debera Lat, PA-C   Chief Complaint  Patient presents with  . Chest Pain   Subjective    Chest Pain  This is a new problem. Episode onset: 1.5 weeks. The problem has been gradually worsening. The pain is present in the substernal region. The pain is moderate (when lying down). The quality of the pain is described as heavy. The pain does not radiate. Associated symptoms include dizziness, headaches, nausea and shortness of breath. Pertinent negatives include no abdominal pain, cough or vomiting. The pain is aggravated by nothing. She has tried NSAIDs for the symptoms. The treatment provided mild relief.    Patient C/O chest tightness/pain especially during the night. She reports she has been under a lot of stress at work. She reports symptoms have been worsening. She reports she has tried cutting back on caffeine.    Medications: Outpatient Medications Prior to Visit  Medication Sig  . ibuprofen (ADVIL,MOTRIN) 800 MG tablet Take 1 tablet (800 mg total) by mouth every 8 (eight) hours as needed for headache (with food).  . gabapentin (NEURONTIN) 100 MG capsule Take 1 capsule (100 mg total) by mouth 3 (three) times daily. (Patient not taking: Reported on 01/11/2023)   No facility-administered medications prior to visit.    Review of Systems  Respiratory:  Positive for shortness of breath. Negative for cough.   Cardiovascular:  Positive for chest pain. Negative for leg swelling.  Gastrointestinal:  Positive for nausea. Negative for abdominal pain and vomiting.  Neurological:  Positive for dizziness and  headaches. Negative for light-headedness.    {Labs  Heme  Chem  Endocrine  Serology  Results Review (optional):23779}   Objective    BP 112/73 (BP Location: Right Arm, Patient Position: Sitting, Cuff Size: Large)   Pulse 62   Temp 98 F (36.7 C)   Resp 16   Wt 216 lb (98 kg)   LMP 01/01/2023 (Exact Date)   SpO2 99%   BMI 33.83 kg/m  {Show previous vital signs (optional):23777}  Physical Exam  ***  No results found for any visits on 01/11/23.  Assessment & Plan     Chest pain  Palpitations? EKG   No follow-ups on file.      {provider attestation***:1}   Debera Lat, PA-C  [facility] [phone] (phone) [phone] (fax)  [facility]

## 2023-01-12 LAB — COMPREHENSIVE METABOLIC PANEL
ALT: 25 IU/L (ref 0–32)
AST: 22 IU/L (ref 0–40)
Albumin/Globulin Ratio: 1.6 (ref 1.2–2.2)
Albumin: 4.4 g/dL (ref 3.9–4.9)
Alkaline Phosphatase: 120 IU/L (ref 44–121)
BUN/Creatinine Ratio: 11 (ref 9–23)
BUN: 9 mg/dL (ref 6–20)
Bilirubin Total: 0.4 mg/dL (ref 0.0–1.2)
CO2: 23 mmol/L (ref 20–29)
Calcium: 9.4 mg/dL (ref 8.7–10.2)
Chloride: 102 mmol/L (ref 96–106)
Creatinine, Ser: 0.84 mg/dL (ref 0.57–1.00)
Globulin, Total: 2.8 g/dL (ref 1.5–4.5)
Glucose: 84 mg/dL (ref 70–99)
Potassium: 4.6 mmol/L (ref 3.5–5.2)
Sodium: 140 mmol/L (ref 134–144)
Total Protein: 7.2 g/dL (ref 6.0–8.5)
eGFR: 91 mL/min/{1.73_m2} (ref >59)

## 2023-01-12 LAB — CBC WITH DIFFERENTIAL/PLATELET
Basophils Absolute: 0 10*3/uL (ref 0.0–0.2)
Basos: 0 %
EOS (ABSOLUTE): 0.2 10*3/uL (ref 0.0–0.4)
Eos: 2 %
Hematocrit: 43.7 % (ref 34.0–46.6)
Hemoglobin: 14.6 g/dL (ref 11.1–15.9)
Immature Grans (Abs): 0 10*3/uL (ref 0.0–0.1)
Immature Granulocytes: 0 %
Lymphocytes Absolute: 1.9 10*3/uL (ref 0.7–3.1)
Lymphs: 22 %
MCH: 32.2 pg (ref 26.6–33.0)
MCHC: 33.4 g/dL (ref 31.5–35.7)
MCV: 97 fL (ref 79–97)
Monocytes Absolute: 0.6 10*3/uL (ref 0.1–0.9)
Monocytes: 6 %
Neutrophils Absolute: 6 10*3/uL (ref 1.4–7.0)
Neutrophils: 70 %
Platelets: 315 10*3/uL (ref 150–450)
RBC: 4.53 x10E6/uL (ref 3.77–5.28)
RDW: 11.7 % (ref 11.7–15.4)
WBC: 8.7 10*3/uL (ref 3.4–10.8)

## 2023-01-13 DIAGNOSIS — R0789 Other chest pain: Secondary | ICD-10-CM | POA: Insufficient documentation

## 2023-01-13 DIAGNOSIS — F419 Anxiety disorder, unspecified: Secondary | ICD-10-CM | POA: Insufficient documentation

## 2023-01-13 DIAGNOSIS — R002 Palpitations: Secondary | ICD-10-CM | POA: Insufficient documentation

## 2023-01-13 NOTE — Progress Notes (Signed)
Please, let pt know that all her lab results WNL including hemoglobin, electrolytes, liver or kidney function.

## 2023-01-14 ENCOUNTER — Encounter: Payer: Self-pay | Admitting: Physician Assistant

## 2023-02-08 ENCOUNTER — Ambulatory Visit: Payer: Commercial Managed Care - PPO | Admitting: Physician Assistant

## 2023-02-08 VITALS — BP 129/69 | HR 57 | Temp 97.9°F | Wt 216.0 lb

## 2023-02-08 DIAGNOSIS — F419 Anxiety disorder, unspecified: Secondary | ICD-10-CM | POA: Diagnosis not present

## 2023-02-08 DIAGNOSIS — R002 Palpitations: Secondary | ICD-10-CM

## 2023-02-08 DIAGNOSIS — R0789 Other chest pain: Secondary | ICD-10-CM

## 2023-02-08 NOTE — Progress Notes (Unsigned)
Established patient visit  Patient: Christine Banks   DOB: [birth year]   37 y.o. Female  MRN: [id number] Visit Date: 02/08/2023  Today's healthcare provider: Debera Lat, PA-C   No chief complaint on file.  Subjective    HPI  Patient is a 37 year old female who presents with continued chest pressure that was previously associated with anxiety.  Patient states she is improving and having more energy.  She states that she had tried to increase the Celexa after the first week as it was instructed but she said with just the first pill she became too sleepy.  She went back to just 1 pill per day and feels she is doing well.  Her symptoms are better but not gone.      02/08/2023   10:11 AM 01/11/2023    3:17 PM 06/20/2022    2:11 PM  Depression screen PHQ 2/9  Decreased Interest 1 2 1   Down, Depressed, Hopeless 1 1 1   PHQ - 2 Score 2 3 2   Altered sleeping 1 3 1   Tired, decreased energy 1 2 2   Change in appetite 1 2 2   Feeling bad or failure about yourself  0 0 1  Trouble concentrating 1 1 1   Moving slowly or fidgety/restless 0 0 0  Suicidal thoughts 0 0 0  PHQ-9 Score 6 11 9   Difficult doing work/chores Not difficult at all Somewhat difficult Not difficult at all      01/11/2023    3:18 PM  GAD 7 : Generalized Anxiety Score  Nervous, Anxious, on Edge 3  Control/stop worrying 3  Worry too much - different things 3  Trouble relaxing 2  Restless 1  Easily annoyed or irritable 2  Afraid - awful might happen 0  Total GAD 7 Score 14  Anxiety Difficulty Somewhat difficult    Medications: Outpatient Medications Prior to Visit  Medication Sig   citalopram (CELEXA) 10 MG tablet Take 1 tab daily for a week and add a second tab if symptoms persist   gabapentin (NEURONTIN) 100 MG capsule Take 1 capsule (100 mg total) by mouth 3 (three) times daily.   ibuprofen (ADVIL,MOTRIN) 800 MG tablet Take 1 tablet (800 mg total) by mouth every 8 (eight) hours as needed for headache (with food).   No  facility-administered medications prior to visit.    Review of Systems  Psychiatric/Behavioral:  Negative for agitation, behavioral problems, confusion, decreased concentration, dysphoric mood, hallucinations, self-injury, sleep disturbance and suicidal ideas. The patient is nervous/anxious. The patient is not hyperactive.   All other systems reviewed and are negative.  Except see HPI   {Labs  Heme  Chem  Endocrine  Serology  Results Review (optional):23779}   Objective    BP 129/69 (BP Location: Right Arm, Patient Position: Sitting, Cuff Size: Normal)   Pulse (!) 57   Temp 97.9 F (36.6 C) (Oral)   Wt 216 lb (98 kg)   LMP 01/01/2023 (Exact Date)   SpO2 100%   BMI 33.83 kg/m  {Show previous vital signs (optional):23777}  Physical Exam Vitals reviewed.  Constitutional:      General: She is not in acute distress.    Appearance: Normal appearance. She is well-developed. She is not diaphoretic.  HENT:     Head: Normocephalic and atraumatic.  Eyes:     General: No scleral icterus.    Conjunctiva/sclera: Conjunctivae normal.  Neck:     Thyroid: No thyromegaly.  Cardiovascular:     Rate and Rhythm: Normal rate  and regular rhythm.     Pulses: Normal pulses.     Heart sounds: Normal heart sounds. No murmur heard. Pulmonary:     Effort: Pulmonary effort is normal. No respiratory distress.     Breath sounds: Normal breath sounds. No wheezing, rhonchi or rales.  Musculoskeletal:     Cervical back: Neck supple.     Right lower leg: No edema.     Left lower leg: No edema.  Lymphadenopathy:     Cervical: No cervical adenopathy.  Skin:    General: Skin is warm and dry.     Findings: No rash.  Neurological:     Mental Status: She is alert and oriented to person, place, and time. Mental status is at baseline.  Psychiatric:        Mood and Affect: Mood normal.        Behavior: Behavior normal.      No results found for any visits on 02/08/23.  Assessment & Plan    IBS  pep to-bismol Constipation.diarrhea twice once in the past  Tum Thyroid hair , hot intolerance,fatigue Anxiety Celexa    Return in about 6 weeks (around 03/22/2023) for CPE, chronic disease f/u.    Will complete labs. The patient was advised to call back or seek an in-person evaluation if the symptoms worsen or if the condition fails to improve as anticipated.  I discussed the assessment and treatment plan with the patient. The patient was provided an opportunity to ask questions and all were answered. The patient agreed with the plan and demonstrated an understanding of the instructions.  I, Debera Lat, PA-C have reviewed all documentation for this visit. The documentation on  02/08/23  for the exam, diagnosis, procedures, and orders are all accurate and complete.  Debera Lat, [facility], MMS [facility] [phone] (phone) [phone] (fax)  [facility]

## 2023-02-10 ENCOUNTER — Encounter: Payer: Self-pay | Admitting: Physician Assistant

## 2023-02-26 ENCOUNTER — Other Ambulatory Visit: Payer: Self-pay | Admitting: Physician Assistant

## 2023-02-26 DIAGNOSIS — F419 Anxiety disorder, unspecified: Secondary | ICD-10-CM

## 2023-02-26 DIAGNOSIS — R002 Palpitations: Secondary | ICD-10-CM

## 2023-02-26 MED ORDER — CITALOPRAM HYDROBROMIDE 10 MG PO TABS
ORAL_TABLET | ORAL | 1 refills | Status: DC
Start: 2023-02-26 — End: 2023-03-21

## 2023-03-21 ENCOUNTER — Other Ambulatory Visit: Payer: Self-pay | Admitting: Physician Assistant

## 2023-03-21 DIAGNOSIS — F419 Anxiety disorder, unspecified: Secondary | ICD-10-CM

## 2023-03-21 DIAGNOSIS — R002 Palpitations: Secondary | ICD-10-CM

## 2023-03-21 NOTE — Telephone Encounter (Signed)
Approved for 180# with 1 refill citalopram HBR 10mg  tablet

## 2023-03-21 NOTE — Telephone Encounter (Signed)
Requested medication (s) are due for refill today: Yes  Requested medication (s) are on the active medication list: Yes  Last refill:  02/26/23  Future visit scheduled: No  Notes to clinic:  Pt. Requests 20 mg tablet, she has been taking 2 of 10 mg. Pharmacy requests 90 day supply.    Requested Prescriptions  Pending Prescriptions Disp Refills   citalopram (CELEXA) 10 MG tablet [Pharmacy Med Name: CITALOPRAM HBR 10 MG TABLET] 159 tablet 1    Sig: Take 1 tab daily for a week and add a second tab if symptoms persist     Psychiatry:  Antidepressants - SSRI Passed - 03/21/2023 11:33 AM      Passed - Valid encounter within last 6 months    Recent Outpatient Visits           1 month ago Anxiety   [place] [facility] [place], [place], PA-C   2 months ago Anxiety   [place] [facility] [place], [place], PA-C   9 months ago Neck pain   [place] [facility] [place], [place], PA-C   10 months ago Nerve pain    [facility] [place], [place], PA-C   1 year ago Encounter for hepatitis C screening test for low risk patient   [place] [facility], [place], [place]

## 2023-04-09 ENCOUNTER — Ambulatory Visit: Payer: Commercial Managed Care - PPO | Admitting: Physician Assistant

## 2023-04-09 ENCOUNTER — Encounter: Payer: Self-pay | Admitting: Physician Assistant

## 2023-04-09 VITALS — BP 120/79 | HR 76 | Temp 98.6°F | Ht 66.0 in | Wt 218.9 lb

## 2023-04-09 DIAGNOSIS — M549 Dorsalgia, unspecified: Secondary | ICD-10-CM

## 2023-04-09 DIAGNOSIS — M79602 Pain in left arm: Secondary | ICD-10-CM

## 2023-04-09 DIAGNOSIS — G4486 Cervicogenic headache: Secondary | ICD-10-CM

## 2023-04-09 DIAGNOSIS — R0789 Other chest pain: Secondary | ICD-10-CM | POA: Diagnosis not present

## 2023-04-09 DIAGNOSIS — J029 Acute pharyngitis, unspecified: Secondary | ICD-10-CM

## 2023-04-09 DIAGNOSIS — M542 Cervicalgia: Secondary | ICD-10-CM

## 2023-04-09 DIAGNOSIS — F419 Anxiety disorder, unspecified: Secondary | ICD-10-CM | POA: Diagnosis not present

## 2023-04-09 LAB — POCT RAPID STREP A (OFFICE): Rapid Strep A Screen: NEGATIVE

## 2023-04-09 NOTE — Progress Notes (Unsigned)
Established patient visit  Patient: Christine Banks   DOB: [birth year]   37 y.o. Female  MRN: [id number] Visit Date: 04/09/2023  Today's healthcare provider: Debera Lat, PA-C   No chief complaint on file.  Subjective    HPI HPI   Pt is having radiating pain on left side of middle of back to underneath left arm and shoulder/arm pain. Pt also has a sharp pain on left side of neck that shoots to bottom of her skull. Pt also has a sore throat that started around 3-4 days ago; her son had strep a week ago. Last edited by Daneen Schick, CMA on 04/09/2023  9:32 AM.      *** Discussed the use of AI scribe software for clinical note transcription with the patient, who gave verbal consent to proceed.  History of Present Illness               04/09/2023    9:35 AM 02/08/2023   10:11 AM 01/11/2023    3:17 PM  Depression screen PHQ 2/9  Decreased Interest 1 1 2   Down, Depressed, Hopeless 1 1 1   PHQ - 2 Score 2 2 3   Altered sleeping 3 1 3   Tired, decreased energy 2 1 2   Change in appetite 2 1 2   Feeling bad or failure about yourself  0 0 0  Trouble concentrating 1 1 1   Moving slowly or fidgety/restless 1 0 0  Suicidal thoughts 0 0 0  PHQ-9 Score 11 6 11   Difficult doing work/chores Somewhat difficult Not difficult at all Somewhat difficult      04/09/2023    9:35 AM 01/11/2023    3:18 PM  GAD 7 : Generalized Anxiety Score  Nervous, Anxious, on Edge 2 3  Control/stop worrying 1 3  Worry too much - different things 3 3  Trouble relaxing 1 2  Restless 0 1  Easily annoyed or irritable 2 2  Afraid - awful might happen 0 0  Total GAD 7 Score 9 14  Anxiety Difficulty Not difficult at all Somewhat difficult    Medications: Outpatient Medications Prior to Visit  Medication Sig Note   citalopram (CELEXA) 10 MG tablet TAKE 1 TAB DAILY FOR A WEEK AND ADD A SECOND TAB IF SYMPTOMS PERSIST 04/09/2023: Pt taking 20 mg   Dapsone 5 % topical gel APPLY TO AFFECTED AREA TWICE A DAY    gabapentin  (NEURONTIN) 100 MG capsule Take 1 capsule (100 mg total) by mouth 3 (three) times daily.    tretinoin (RETIN-A) 0.05 % cream SMARTSIG:Sparingly Topical Every Night    No facility-administered medications prior to visit.    Review of Systems Except see HPI   {Labs  Heme  Chem  Endocrine  Serology  Results Review (optional):23779}   Objective    BP 120/79   Pulse 76   Temp 98.6 F (37 C) (Oral)   Ht 5\' 6"  (1.676 m)   Wt 218 lb 14.4 oz (99.3 kg)   SpO2 100%   BMI 35.33 kg/m  {Show previous vital signs (optional):23777}  Physical Exam   No results found for any visits on 04/09/23.  Assessment & Plan    *** Assessment and Plan              No follow-ups on file.     The patient was advised to call back or seek an in-person evaluation if the symptoms worsen or if the condition fails to improve as anticipated.  I discussed the assessment  and treatment plan with the patient. The patient was provided an opportunity to ask questions and all were answered. The patient agreed with the plan and demonstrated an understanding of the instructions.  I, Debera Lat, PA-C have reviewed all documentation for this visit. The documentation on  @CurDate @  for the exam, diagnosis, procedures, and orders are all accurate and complete.  Debera Lat, [facility], MMS [facility] [phone] (phone) [phone] (fax)  [facility]

## 2023-04-10 LAB — TROPONIN T: Troponin T (Highly Sensitive): <6 ng/L (ref 0–14)

## 2023-05-07 ENCOUNTER — Ambulatory Visit: Payer: Commercial Managed Care - PPO | Admitting: Physician Assistant

## 2023-05-07 ENCOUNTER — Encounter: Payer: Self-pay | Admitting: Physician Assistant

## 2023-05-07 VITALS — BP 111/83 | HR 58 | Temp 98.0°F | Ht 66.0 in | Wt 215.0 lb

## 2023-05-07 DIAGNOSIS — G4486 Cervicogenic headache: Secondary | ICD-10-CM | POA: Insufficient documentation

## 2023-05-07 DIAGNOSIS — M549 Dorsalgia, unspecified: Secondary | ICD-10-CM | POA: Insufficient documentation

## 2023-05-07 DIAGNOSIS — M79602 Pain in left arm: Secondary | ICD-10-CM | POA: Diagnosis not present

## 2023-05-07 DIAGNOSIS — E669 Obesity, unspecified: Secondary | ICD-10-CM

## 2023-05-07 MED ORDER — CYCLOBENZAPRINE HCL 5 MG PO TABS
5.0000 mg | ORAL_TABLET | Freq: Three times a day (TID) | ORAL | 1 refills | Status: DC | PRN
Start: 2023-05-07 — End: 2023-12-29
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
## 2023-05-09 ENCOUNTER — Telehealth: Payer: Self-pay | Admitting: Physician Assistant

## 2023-05-09 DIAGNOSIS — R0789 Other chest pain: Secondary | ICD-10-CM

## 2023-05-09 DIAGNOSIS — R002 Palpitations: Secondary | ICD-10-CM

## 2023-05-09 DIAGNOSIS — E669 Obesity, unspecified: Secondary | ICD-10-CM

## 2023-05-09 NOTE — Telephone Encounter (Signed)
Covermymeds is requesting prior authorization' Key: Mariane Duval Name: Christine Banks 0.25mg /0.6ml auto injectors

## 2023-05-09 NOTE — Telephone Encounter (Signed)
Covermymeds is requesting prior authorization Key: [id number] Name: Christine Banks 2.5mg /0.36ml auto injectors

## 2023-05-10 ENCOUNTER — Ambulatory Visit: Payer: Commercial Managed Care - PPO | Admitting: Physician Assistant

## 2023-05-13 ENCOUNTER — Encounter: Payer: Self-pay | Admitting: Physician Assistant

## 2023-05-13 ENCOUNTER — Ambulatory Visit: Payer: Commercial Managed Care - PPO | Admitting: Physician Assistant

## 2023-05-13 MED ORDER — WEGOVY 0.25 MG/0.5ML ~~LOC~~ SOAJ
0.2500 mg | SUBCUTANEOUS | 1 refills | Status: DC
Start: 2023-05-13 — End: 2023-11-13

## 2023-05-13 NOTE — Telephone Encounter (Signed)
PA initiated-no medication has been sent to the pharmacy. PA that came from the pharmacy was for [facility].

## 2023-05-13 NOTE — Telephone Encounter (Signed)
Can a prescription for wegovy be sent in to patient's pharmacy please.

## 2023-05-14 NOTE — Telephone Encounter (Signed)
Received a fax from covermymeds for Wegovy 0.25mg /0.26ml with a different key  Key: [id number]

## 2023-05-14 NOTE — Telephone Encounter (Signed)
Per patient Reginal Lutes was denied

## 2023-05-14 NOTE — Telephone Encounter (Signed)
Covermymeds is waiting for medication zebound 2.5MG /0.5ML Auto-injectors    Key: [id number]

## 2023-05-15 ENCOUNTER — Other Ambulatory Visit: Payer: Self-pay | Admitting: Physician Assistant

## 2023-05-15 DIAGNOSIS — E669 Obesity, unspecified: Secondary | ICD-10-CM

## 2023-05-15 DIAGNOSIS — E66811 Obesity, class 1: Secondary | ICD-10-CM

## 2023-06-05 ENCOUNTER — Encounter: Payer: Self-pay | Admitting: Dietician

## 2023-06-05 ENCOUNTER — Encounter: Payer: Commercial Managed Care - PPO | Attending: Physician Assistant | Admitting: Dietician

## 2023-06-05 VITALS — Ht 66.0 in | Wt 216.2 lb

## 2023-06-05 DIAGNOSIS — E669 Obesity, unspecified: Secondary | ICD-10-CM | POA: Insufficient documentation

## 2023-06-05 DIAGNOSIS — Z713 Dietary counseling and surveillance: Secondary | ICD-10-CM | POA: Insufficient documentation

## 2023-06-05 DIAGNOSIS — Z6834 Body mass index (BMI) 34.0-34.9, adult: Secondary | ICD-10-CM | POA: Diagnosis not present

## 2023-06-05 NOTE — Patient Instructions (Addendum)
Weigh yourself once weekly only! Look for 1-2 pounds of weight loss per week.  Continue eating three meals a day, about 5-6 hours apart!  Begin to recognize and view your food choices as carbohydrates, proteins, and non-starchy vegetables!  Begin to build your meals using the proportions of the Balanced Plate. First, select your carb choice(s) for the meal. Make this 25% of your meal. Next, select your source of protein to pair with your carb choice(s). Make this another 25% of your meal. Finally, complete your meal with a variety of non-starchy vegetables. Make this the remaining 50% of your meal.   Keep up the great work tracking your food choices. Use this as your accountability partner.

## 2023-06-05 NOTE — Progress Notes (Signed)
Medical Nutrition Therapy  Appointment Start time:  [phone]  Appointment End time:  0930  Primary concerns today: Weight Loss  Referral diagnosis: E66.9 - Obesity Preferred learning style: No preference indicated Learning readiness: Change in progress   NUTRITION ASSESSMENT   Anthropometrics  Ht: 66" Wt: 216.2 lbs BMI: 34.90 kg/m2 Goal Weight: 165 lbs. Weight history: heaviest of 250 lbs. (2019), lowest of 185 lbs (2022)   Clinical Medical Hx: Obesity, Cholecystectomy Medications: Gabapentin, Citalopram Labs: N/A Notable Signs/Symptoms: N/A  Lifestyle & Dietary Hx Pt reports following a 1,500 kcal diet for the last 2 weeks, weighing each morning, pt reports wanting to start [facility] but insurance won't cover it unless pt does 6 months of weight management therapy. Pt reports history of weight loss, used Peleton 6 days a week, history of using phentermine. Pt reports using LoseIt app to track their food choices, states it helps with accountability and making healthier food choices. Pt reports intermittent fasting from 7:30 PM to 7:30 AM. Pt reports liking sweets, overeats at times, has started choosing lower calorie desserts. Pt reports using a homechef meal prep service service, states it has helped them bring meals to work to eat instead of skipping them. 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 Pt reports some back/neck pain that can interfere with exercise from a previous CVA, controlled with gabapentin.   Estimated daily fluid intake: 64 oz Supplements: Ginko boloba, Probiotic, B complex, O3 FAs, Glucosamine Sleep: Sleeps ok most days Stress / self-care: Mild Current average weekly physical activity: 11,000 - 13,00 steps daily   24-Hr Dietary Recall First Meal: Bacon and cheese hot pocket, Celsius energy drink Snack:  none Second Meal: PB&J uncrustable, Fruit smoothie (mango and banana puree, water) 12 oz Snack: none Third Meal: Low fat, String cheese, 1 tbsp PB, 1/2 Tbsp hazelnut almond butter, 10 air fried wings Snack: Chocolate PB Yaso bar Beverages: Celsius, water   NUTRITION DIAGNOSIS  NB-1.1 Food and nutrition-related knowledge deficit As related to obesity.  As evidenced by BMI .   NUTRITION INTERVENTION  Nutrition education (E-1) on the following topics:  Educated patient on the two components of energy balance: Energy in (calories), and energy out (activity). Explain the role of negative energy balance in weight loss. Discussed options with patient to achieve a negative energy balance and how to best control energy in and energy out to accommodate their lifestyle. Educated patient on the balanced plate eating model. Recommended lunch and dinner be 1/2 non-starchy vegetables, 1/4 starches, and 1/4 protein. Recommended breakfast be a balance of starch and protein with a piece of fruit. Discussed with patient the importance of working towards hitting the proportions of the balanced plate consistently. Counseled patient on ways to begin recognizing each of the food groups from the balanced plate in their own meals, and how close they are to fitting the recommended proportions of the balanced plate. Educated patient on the nutritional value of each food group on the balanced plate model. Counseled patient on beginning to rebuild their trust in themselves to make the right food choices for their health. Educated patient on mindful eating, including listening to their body's hunger and satiety cues, as well as eating slowly and allowing meals to be more of a sensory experience. Counseled patient on allowing themselves to be present in their emotions when they consider emotional eating. Advised  patient to evaluate whether the impulse to eat is hunger based, or emotionally driven.   Handouts Provided Include   Balanced Plate  Learning Style & Readiness for Change Teaching method utilized: Visual & Auditory  Demonstrated degree of understanding via: Teach Back  Barriers to learning/adherence to lifestyle change: None  Goals Established by Pt Weigh yourself once weekly only! Look for 1-2 pounds of weight loss per week. Continue eating three meals a day, about 5-6 hours apart! Begin to recognize and view your food choices as carbohydrates, proteins, and non-starchy vegetables! Begin to build your meals using the proportions of the Balanced Plate. First, select your carb choice(s) for the meal. Make this 25% of your meal. Next, select your source of protein to pair with your carb choice(s). Make this another 25% of your meal. Finally, complete your meal with a variety of non-starchy vegetables. Make this the remaining 50% of your meal. Keep up the great work tracking your food choices. Use this as your accountability partner.   MONITORING & EVALUATION Dietary intake, weekly physical activity, and weight loss in 6-8 weeks.  Next Steps  Patient is to follow up with RD.

## 2023-06-15 ENCOUNTER — Other Ambulatory Visit: Payer: Self-pay | Admitting: Physician Assistant

## 2023-06-15 DIAGNOSIS — F419 Anxiety disorder, unspecified: Secondary | ICD-10-CM

## 2023-06-15 DIAGNOSIS — R002 Palpitations: Secondary | ICD-10-CM

## 2023-06-17 NOTE — Telephone Encounter (Signed)
Requested Prescriptions  Pending Prescriptions Disp Refills   citalopram (CELEXA) 10 MG tablet [Pharmacy Med Name: CITALOPRAM HBR 10 MG TABLET] 180 tablet 1    Sig: TAKE 1 TAB DAILY FOR A WEEK AND ADD A SECOND TAB IF SYMPTOMS PERSIST     Psychiatry:  Antidepressants - SSRI Passed - 06/15/2023 10:31 AM      Passed - Valid encounter within last 6 months    Recent Outpatient Visits           1 month ago Left-sided back pain, unspecified back location, unspecified chronicity   [place] [facility] [place], [place], PA-C   2 months ago Other chest pain   [place] [facility] [place], [place], PA-C   4 months ago Anxiety   [place] [facility] [place], [place], PA-C   5 months ago Anxiety   [place] [facility] [place], [place], [place]   12 months ago Neck pain   [place] [facility] [place], [place], [place]

## 2023-06-18 ENCOUNTER — Other Ambulatory Visit: Payer: Self-pay | Admitting: Physician Assistant

## 2023-06-18 DIAGNOSIS — R002 Palpitations: Secondary | ICD-10-CM

## 2023-06-18 DIAGNOSIS — F419 Anxiety disorder, unspecified: Secondary | ICD-10-CM

## 2023-06-18 NOTE — Telephone Encounter (Signed)
Medication Refill - Medication: citalopram (CELEXA) 10 MG tablet  Pt will be out of medication tomorrow. Pt has been taking double the dose (per her doctor)  which is why she is needing a refill.   Has the patient contacted their pharmacy? Yes.   [facility] that she requested [facility] refill too soon.   Preferred Pharmacy (with phone number or street name):  [id number] [place] [address]  Phone: [phone] Fax: [phone]  Has the patient been seen for an appointment in the last year OR does the patient have an upcoming appointment? Yes.    Agent: Please be advised that RX refills may take up to 3 business days. We ask that you follow-up with your pharmacy.

## 2023-07-29 ENCOUNTER — Other Ambulatory Visit: Payer: Self-pay | Admitting: Physician Assistant

## 2023-07-29 DIAGNOSIS — M792 Neuralgia and neuritis, unspecified: Secondary | ICD-10-CM

## 2023-07-29 DIAGNOSIS — M5440 Lumbago with sciatica, unspecified side: Secondary | ICD-10-CM

## 2023-07-29 DIAGNOSIS — R200 Anesthesia of skin: Secondary | ICD-10-CM

## 2023-07-29 DIAGNOSIS — M542 Cervicalgia: Secondary | ICD-10-CM

## 2023-07-29 DIAGNOSIS — M549 Dorsalgia, unspecified: Secondary | ICD-10-CM

## 2023-07-29 DIAGNOSIS — R202 Paresthesia of skin: Secondary | ICD-10-CM

## 2023-07-30 NOTE — Telephone Encounter (Signed)
Requested medications are due for refill today.  unsure  Requested medications are on the active medications list.  yes  Last refill. 05/10/2022 #90 3 rf  Future visit scheduled.   no  Notes to clinic.  Last refill over 1 year ago.    Requested Prescriptions  Pending Prescriptions Disp Refills   gabapentin (NEURONTIN) 100 MG capsule [Pharmacy Med Name: GABAPENTIN 100 MG CAPSULE] 90 capsule 3    Sig: TAKE 1 CAPSULE (100 MG TOTAL) BY MOUTH THREE TIMES DAILY.     Neurology: Anticonvulsants - gabapentin Passed - 07/29/2023  8:58 PM      Passed - Cr in normal range and within 360 days    Creatinine, Ser  Date Value Ref Range Status  01/11/2023 0.84 0.57 - 1.00 mg/dL Final         Passed - Completed PHQ-2 or PHQ-9 in the last 360 days      Passed - Valid encounter within last 12 months    Recent Outpatient Visits           2 months ago Left-sided back pain, unspecified back location, unspecified chronicity   [place] [facility] [place], [place], PA-C   3 months ago Other chest pain   [place] [facility] [place], [place], PA-C   5 months ago Anxiety   [place] [facility] [place], [place], PA-C   6 months ago Anxiety   [place] [facility] [place], [place], PA-C   1 year ago Neck pain   [place] [facility] [place], [place], [place]

## 2023-07-31 ENCOUNTER — Encounter: Payer: Self-pay | Admitting: Dietician

## 2023-07-31 ENCOUNTER — Encounter: Payer: Commercial Managed Care - PPO | Attending: Physician Assistant | Admitting: Dietician

## 2023-07-31 VITALS — Ht 66.0 in | Wt 208.8 lb

## 2023-07-31 DIAGNOSIS — E66811 Obesity, class 1: Secondary | ICD-10-CM | POA: Insufficient documentation

## 2023-07-31 NOTE — Progress Notes (Signed)
Medical Nutrition Therapy  Appointment Start time:  [phone]  Appointment End time:  0850  Primary concerns today: Weight Loss  Referral diagnosis: E66.9 - Obesity Preferred learning style: No preference indicated Learning readiness: Change in progress   NUTRITION ASSESSMENT   Anthropometrics  Ht: 66" Wt: 208.8 lbs BMI: 33.70 kg/m2 Wt Change: -7.4 lbs x 6 weeks Goal Weight: 165 lbs. Weight history: heaviest of 250 lbs. (2019), lowest of 185 lbs (2022)   Clinical Medical Hx: Obesity, Cholecystectomy Medications: Gabapentin, Citalopram Labs: N/A Notable Signs/Symptoms: N/A   Lifestyle & Dietary Hx Pt reports being very adamant about tracking what they have been eating, stopped having Vilma Meckel and replaced it with homemade PB/Chocolate protein shake, eating Quest Bars at work. Pt reports continuing to use LoseIt app to track food, also using Omada app to track weight due to insurance wanting them to track with and weigh daily to approve Wegovy. 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 Pt reports doing 15 minute Peleton/Omada workouts (pilates, weight training) before work, 2-3 times a week, working with [employer]. Pt reports stopping their probiotic, states they did not see it helping   Estimated daily fluid intake: 64 oz Supplements: Ginko boloba, B complex, O3 FAs, Glucosamine Sleep: Sleep quality has gone down r/t carpal tunnel pain/numbness Stress / self-care: Mild Current average weekly physical activity: 11,000 - 13,00 steps daily, walking dog daily   24-Hr Dietary Recall First Meal: Protein shake, mini yogurt bar, celsius Snack:  Second Meal: Handful of pistachios, ham and low carb tortilla wrap Snack: 10 PB pretzel bites. Third Meal: Mindi Slicker fish sandwich, Onion rings, diet coke Snack:  Beverages: Celsius, water, diet  coke   NUTRITION DIAGNOSIS  NB-1.1 Food and nutrition-related knowledge deficit As related to obesity.  As evidenced by BMI of 33.70 kg/m2.   NUTRITION INTERVENTION  Nutrition education (E-1) on the following topics:  Educated patient on the two components of energy balance: Energy in (calories), and energy out (activity). Explain the role of negative energy balance in weight loss. Discussed options with patient to achieve a negative energy balance and how to best control energy in and energy out to accommodate their lifestyle. Educated patient on the balanced plate eating model. Recommended lunch and dinner be 1/2 non-starchy vegetables, 1/4 starches, and 1/4 protein. Recommended breakfast be a balance of starch and protein with a piece of fruit. Discussed with patient the importance of working towards hitting the proportions of the balanced plate consistently. Counseled patient on ways to begin recognizing each of the food groups from the balanced plate in their own meals, and how close they are to fitting the recommended proportions of the balanced plate. Educated patient on the nutritional value of each food group on the balanced plate model. Counseled patient on beginning to rebuild their trust in themselves to make the right food choices for their health. Educated patient on mindful eating, including listening to their body's hunger and satiety cues, as well as eating slowly and allowing meals to be more of a sensory experience. Counseled patient on allowing themselves to be present in their emotions when they consider emotional eating. Advised patient to evaluate whether the impulse to eat is hunger based, or emotionally driven.   Handouts Provided Include  Balanced Plate  Learning Style & Readiness for Change Teaching method utilized: Visual & Auditory  Demonstrated degree  of understanding via: Teach Back  Barriers to learning/adherence to lifestyle change: None  Goals Established by  Pt Look into Harvest Snaps for a healthier replacement for chips. Consider a natural sleep aid like melatonin, or ZzzQuil. Remember to only look at long term trends in your weight, your weight will fluctuate daily! Keep up the great work!!!   MONITORING & EVALUATION Dietary intake, weekly physical activity, and weight loss in 3 months.  Next Steps  Patient is to follow up with RD.

## 2023-07-31 NOTE — Patient Instructions (Addendum)
Look into Harvest Snaps for a healthier replacement for chips.  Consider a natural sleep aid like melatonin, or ZzzQuil.  Remember to only look at long term trends in your weight, your weight will fluctuate daily!  Keep up the great work!!!

## 2023-08-10 ENCOUNTER — Other Ambulatory Visit: Payer: Self-pay | Admitting: Physician Assistant

## 2023-08-10 DIAGNOSIS — R002 Palpitations: Secondary | ICD-10-CM

## 2023-08-10 DIAGNOSIS — F419 Anxiety disorder, unspecified: Secondary | ICD-10-CM

## 2023-08-12 NOTE — Telephone Encounter (Signed)
Requested medication (s) are due for refill today: yes  Requested medication (s) are on the active medication list: yes  Last refill:  03/21/23  Future visit scheduled: no  Notes to clinic:  Unable to refill per protocol, routing for clarification Sig instructions.      Requested Prescriptions  Pending Prescriptions Disp Refills   citalopram (CELEXA) 10 MG tablet [Pharmacy Med Name: CITALOPRAM HBR 10 MG TABLET] 180 tablet 1    Sig: TAKE 1 TAB DAILY FOR A WEEK AND ADD A SECOND TAB IF SYMPTOMS PERSIST     Psychiatry:  Antidepressants - SSRI Passed - 08/10/2023  8:53 AM      Passed - Valid encounter within last 6 months    Recent Outpatient Visits           3 months ago Left-sided back pain, unspecified back location, unspecified chronicity   [place] [facility] [place], [place], PA-C   4 months ago Other chest pain   [place] [facility] [place], [place], PA-C   6 months ago Anxiety   [place] [facility] [place], [place], PA-C   7 months ago Anxiety   [place] [facility] [place], [place], PA-C   1 year ago Neck pain   [place] [facility] [place], [place], [place]

## 2023-09-28 NOTE — Progress Notes (Signed)
 Established patient visit  Patient: Christine Banks   DOB: 09-19-1986   37 y.o. Female  MRN: [id number] Visit Date: 10/08/2023  Today's healthcare provider: Jolynn Spencer, PA-C   Chief Complaint  Patient presents with   Cough    Appt made 2 weeks ago, had fever in beginning, teledoc appt 2 weeks ago  Improving. Cough worst at night   Nasal Congestion   Subjective      Discussed the use of AI scribe software for clinical note transcription with the patient, who gave verbal consent to proceed.  History of Present Illness   The patient, with a history of respiratory issues, presents with a persistent cough that has been ongoing for about four weeks. The cough is described as phlegmy but not productive of significant mucus. The patient also reports a sore throat that has since resolved. The patient has been self-medicating with Mucinex DM and consulted with a [facility] who suggested the symptoms were viral. The patient also reports a rash on their hands that appears during the colder months and a swollen lymph node behind their ear. The patient also reports heel pain that has been ongoing for about a week. The pain is described as excruciating, especially after periods of rest, and worsens as the day progresses. The patient has been using a foot roller for relief.           10/08/2023    3:10 PM 06/05/2023    8:20 AM 04/09/2023    9:35 AM  Depression screen PHQ 2/9  Decreased Interest 1 0 1  Down, Depressed, Hopeless 1 0 1  PHQ - 2 Score 2 0 2  Altered sleeping 1  3  Tired, decreased energy 1  2  Change in appetite 1  2  Feeling bad or failure about yourself  0  0  Trouble concentrating 1  1  Moving slowly or fidgety/restless 0  1  Suicidal thoughts 0  0  PHQ-9 Score 6  11  Difficult doing work/chores   Somewhat difficult      10/08/2023    3:12 PM 04/09/2023    9:35 AM 01/11/2023    3:18 PM  GAD 7 : Generalized Anxiety Score  Nervous, Anxious, on Edge 1 2 3   Control/stop worrying 0 1  3  Worry too much - different things 1 3 3   Trouble relaxing 0 1 2  Restless 0 0 1  Easily annoyed or irritable 0 2 2  Afraid - awful might happen 0 0 0  Total GAD 7 Score 2 9 14   Anxiety Difficulty  Not difficult at all Somewhat difficult    Medications: Outpatient Medications Prior to Visit  Medication Sig   citalopram  (CELEXA ) 10 MG tablet TAKE 1 TAB DAILY FOR A WEEK AND ADD A SECOND TAB IF SYMPTOMS PERSIST   cyclobenzaprine  (FLEXERIL ) 5 MG tablet Take 1 tablet (5 mg total) by mouth 3 (three) times daily as needed for muscle spasms.   Dapsone 5 % topical gel APPLY TO AFFECTED AREA TWICE A DAY   gabapentin  (NEURONTIN ) 100 MG capsule TAKE 1 CAPSULE (100 MG TOTAL) BY MOUTH THREE TIMES DAILY.   tretinoin  (RETIN-A ) 0.05 % cream SMARTSIG:Sparingly Topical Every Night   Semaglutide -Weight Management (WEGOVY ) 0.25 MG/0.5ML SOAJ Inject 0.25 mg into the skin once a week. (Patient not taking: Reported on 10/08/2023)   No facility-administered medications prior to visit.    Review of Systems  All other systems reviewed and are negative.  All negative Except see  HPI       Objective    BP 114/79   Pulse 85   Temp 98.2 F (36.8 C)   Resp 18   Ht 5' 6 (1.676 m)   Wt 204 lb 1.6 oz (92.6 kg)   SpO2 99%   BMI 32.94 kg/m     Physical Exam Vitals reviewed.  Constitutional:      General: She is not in acute distress.    Appearance: Normal appearance. She is well-developed. She is not diaphoretic.  HENT:     Head: Normocephalic and atraumatic.  Eyes:     General: No scleral icterus.    Conjunctiva/sclera: Conjunctivae normal.  Neck:     Thyroid : No thyromegaly.  Cardiovascular:     Rate and Rhythm: Normal rate and regular rhythm.     Pulses: Normal pulses.     Heart sounds: Normal heart sounds. No murmur heard. Pulmonary:     Effort: Pulmonary effort is normal. No respiratory distress.     Breath sounds: Normal breath sounds. No wheezing, rhonchi or rales.   Musculoskeletal:     Cervical back: Neck supple.     Right lower leg: No edema.     Left lower leg: No edema.  Lymphadenopathy:     Cervical: No cervical adenopathy.  Skin:    General: Skin is warm and dry.     Findings: No rash.  Neurological:     Mental Status: She is alert and oriented to person, place, and time. Mental status is at baseline.  Psychiatric:        Mood and Affect: Mood normal.        Behavior: Behavior normal.      No results found for any visits on 10/08/23.      Assessment and Plan   subacute cough Nasal congestion Post-viral cough Persistent cough for 4 weeks following a viral illness. No other symptoms of infection. Possible contribution from acid reflux and allergies. -Continue Mucinex DM as needed. -Start Nexium or Protonix over the counter for acid reflux. -Start Flonase  and Zyrtec  for possible allergic component. -Consider prescription decongestant if not improving.  Lymphadenopathy Tender, swollen lymph nodes behind the ear and on the neck. Likely viral etiology given the clinical context. -Massage the area gently. -Continue to monitor for changes.  Skin rash Recurrent winter rash on hands and new rash on body. No known triggers. No significant itch or sting. -Apply over-the-counter hydrocortisone cream to affected areas. -Consider stronger prescription steroid cream if not improving.  Heel pain Excruciating pain in the heel, worse with initial weight-bearing and throughout the day. Suspected plantar fasciitis. -Refer to orthopedics for further evaluation and management. -Consider over-the-counter remedies such as inserts, kinesthetic tape, and braces. -Consider swimming for low-impact exercise.  Carpal tunnel syndrome Ongoing management with gabapentin  and injections. Numbness in fingers, considering surgical intervention. -Continue current management and follow up with orthopedics for possible surgery.        Orders Placed This  Encounter  Procedures   AMB referral to orthopedics    Referral Priority:   Urgent    Referral Type:   Consultation    Number of Visits Requested:   1    No follow-ups on file.   The patient was advised to call back or seek an in-person evaluation if the symptoms worsen or if the condition fails to improve as anticipated.  I discussed the assessment and treatment plan with the patient. The patient was provided an opportunity to ask questions  and all were answered. The patient agreed with the plan and demonstrated an understanding of the instructions.  I, Kalei Mckillop, PA-C have reviewed all documentation for this visit. The documentation on 10/08/2023  for the exam, diagnosis, procedures, and orders are all accurate and complete.  Jolynn Spencer, [facility], M[facility] [phone] (phone) [phone] (fax)  [facility]

## 2023-10-08 ENCOUNTER — Ambulatory Visit: Payer: Commercial Managed Care - PPO | Admitting: Physician Assistant

## 2023-10-08 VITALS — BP 114/79 | HR 85 | Temp 98.2°F | Resp 18 | Ht 66.0 in | Wt 204.1 lb

## 2023-10-08 DIAGNOSIS — M79671 Pain in right foot: Secondary | ICD-10-CM | POA: Diagnosis not present

## 2023-10-08 DIAGNOSIS — R0981 Nasal congestion: Secondary | ICD-10-CM

## 2023-10-08 DIAGNOSIS — R591 Generalized enlarged lymph nodes: Secondary | ICD-10-CM

## 2023-10-08 DIAGNOSIS — R21 Rash and other nonspecific skin eruption: Secondary | ICD-10-CM

## 2023-10-08 DIAGNOSIS — G8929 Other chronic pain: Secondary | ICD-10-CM

## 2023-10-08 DIAGNOSIS — R052 Subacute cough: Secondary | ICD-10-CM

## 2023-10-08 MED ORDER — CETIRIZINE HCL 10 MG PO TABS: 10.0000 mg | ORAL_TABLET | Freq: Every day | ORAL | 11 refills | Status: DC

## 2023-10-08 MED ORDER — PROMETHAZINE-DM 6.25-15 MG/5ML PO SYRP
5.0000 mL | ORAL_SOLUTION | Freq: Four times a day (QID) | ORAL | 0 refills | Status: DC | PRN
Start: 2023-10-08 — End: 2023-11-13

## 2023-10-08 MED ORDER — FLUTICASONE PROPIONATE 50 MCG/ACT NA SUSP
2.0000 | Freq: Every day | NASAL | 6 refills | Status: DC
Start: 2023-10-08 — End: 2023-11-14

## 2023-10-08 MED ORDER — HYDROXYZINE HCL 10 MG PO TABS: 10.0000 mg | ORAL_TABLET | Freq: Three times a day (TID) | ORAL | 0 refills | Status: DC | PRN

## 2023-10-08 MED ORDER — IPRATROPIUM BROMIDE 0.03 % NA SOLN: 2.0000 | Freq: Two times a day (BID) | NASAL | 0 refills | Status: DC

## 2023-10-09 ENCOUNTER — Encounter: Payer: Self-pay | Admitting: Physician Assistant

## 2023-10-09 ENCOUNTER — Ambulatory Visit: Payer: Commercial Managed Care - PPO | Admitting: Physician Assistant

## 2023-10-28 ENCOUNTER — Encounter: Payer: Commercial Managed Care - PPO | Attending: Physician Assistant | Admitting: Dietician

## 2023-10-28 ENCOUNTER — Encounter: Payer: Self-pay | Admitting: Dietician

## 2023-10-28 VITALS — Ht 66.0 in | Wt 216.1 lb

## 2023-10-28 DIAGNOSIS — Z6834 Body mass index (BMI) 34.0-34.9, adult: Secondary | ICD-10-CM | POA: Diagnosis not present

## 2023-10-28 DIAGNOSIS — E669 Obesity, unspecified: Secondary | ICD-10-CM | POA: Diagnosis present

## 2023-10-28 DIAGNOSIS — Z713 Dietary counseling and surveillance: Secondary | ICD-10-CM | POA: Diagnosis not present

## 2023-10-28 DIAGNOSIS — E66811 Obesity, class 1: Secondary | ICD-10-CM

## 2023-10-28 NOTE — Progress Notes (Signed)
Medical Nutrition Therapy  Appointment Start time:  83  Appointment End time:  0915  Primary concerns today: Weight Loss  Referral diagnosis: E66.9 - Obesity Preferred learning style: No preference indicated Learning readiness: Change in progress   NUTRITION ASSESSMENT   Anthropometrics  Ht: 66" Wt: 216 lbs BMI: 34.88 kg/m2 Wt Change: +7 lbs in 3 months Goal Weight: 165 lbs. Weight history: heaviest of 250 lbs. (2019), lowest of 185 lbs (2022)   Clinical Medical Hx: Obesity, Cholecystectomy Medications: Gabapentin, Citalopram. Meloxicam, Methylprednisolone  Labs: N/A Notable Signs/Symptoms: N/A   Lifestyle & Dietary Hx Notes taken by Dietetic Intern, Limmie Patricia, under supervision of Hewitt Blade RDN, LDN.   Pt reports viral pneumonia for a month. Pt reports heel pain still present but getting better and wearing splint. Pt reports being put on steroids for heel pain which increased hunger and led to unintended weight gain. Pt still using Omada, taking daily weights will reassess for insurance eligibility for [facility] mid February. Pt states seeing health coach once a week for food tracking and workouts, pt reports feeling stagnant with these measures at the moment. Pt reports busy time at work and unintentionally not eating during work as she was so focused.  Pt reports losing interest in [employer] bars, trouble finding meals and snacks [place] with limited time to eat in the AM. Pt reports stopping food tracking, states that food tracking does help with accountability.   Estimated daily fluid intake:  Supplements: Ginko boloba, B complex, O3 FAs, Glucosamine Sleep: Poor; related to heel pain and having to wear splint Stress / self-care: Mild Current average weekly physical activity: 11,000 - 13,00 steps daily, walking dog daily   24-Hr Dietary Recall First Meal: plain bagel with strawberry cream cheese  Snack:  Second Meal:  Snack:  Third Meal:  Snack: Mini muffins  Beverages:     NUTRITION DIAGNOSIS  NB-1.1 Food and nutrition-related knowledge deficit As related to obesity.  As evidenced by BMI of 33.70 kg/m2.   NUTRITION INTERVENTION  Nutrition education (E-1) on the following topics:  Educated patient on the two components of energy balance: Energy in (calories), and energy out (activity). Explain the role of negative energy balance in weight loss. Discussed options with patient to achieve a negative energy balance and how to best control energy in and energy out to accommodate their lifestyle. Educated patient on the balanced plate eating model. Recommended lunch and dinner be 1/2 non-starchy vegetables, 1/4 starches, and 1/4 protein. Recommended breakfast be a balance of starch and protein with a piece of fruit. Discussed with patient the importance of working towards hitting the proportions of the balanced plate consistently. Counseled patient on ways to begin recognizing each of the food groups from the balanced plate in their own meals, and how close they are to fitting the recommended proportions of the balanced plate. Educated patient on the nutritional value of each food group on the balanced plate model. Counseled patient on beginning to rebuild their trust in themselves to make the right food choices for their health. Educated patient on mindful eating, including listening to their body's hunger and satiety cues, as well as eating slowly and allowing meals to be more of a sensory experience. Counseled patient on allowing themselves to be present in their emotions when they consider emotional eating. Advised patient to evaluate whether the impulse to eat is hunger based, or emotionally driven.   Handouts Provided Include  Balanced Plate  Learning Style & Readiness for Change Teaching  method utilized: Special educational needs teacher  Demonstrated degree of understanding via: Teach Back  Barriers to learning/adherence to lifestyle change: None  Goals Established by  Pt Look into Harvest Snaps for a healthier replacement for chips. Consider a natural sleep aid like melatonin, or ZzzQuil. Return to using your "Lose It" app today to track meals for accountability  Breakfast options on the go; small portion of peanut butter, with fruit combo, boiled eggs, high protein granola bars, Fairlife chocolate milk.  Continue to be as active as possible, whatever amount is tolerable with heel pain.  Try to store foods at work after grocery store, to prevent skipped meals and snacks.  Remember to only look at long term trends in your weight, your weight will fluctuate daily! Keep up the great work!!!   MONITORING & EVALUATION Dietary intake, weekly physical activity, and weight change in 6 weeks.  Next Steps  Patient is to follow up with RD.

## 2023-10-28 NOTE — Patient Instructions (Addendum)
Return to using your "Lose It" app today to track meals for accountability   Breakfast options on the go; small portion of peanut butter, with fruit combo, boiled eggs, high protein granola bars, Fairlife chocolate milk.   Continue to be as active as possible, whatever amount is tolerable with heel pain.   Try to store foods at work after grocery store, to prevent skipped meals and snacks.   Remember to only look at long term trends in your weight, your weight will fluctuate daily!  Keep up the great work!!!

## 2023-11-02 DIAGNOSIS — G5603 Carpal tunnel syndrome, bilateral upper limbs: Secondary | ICD-10-CM

## 2023-11-02 HISTORY — DX: Carpal tunnel syndrome, bilateral upper limbs: G56.03

## 2023-11-12 ENCOUNTER — Other Ambulatory Visit: Payer: Self-pay | Admitting: Surgery

## 2023-11-13 ENCOUNTER — Encounter
Admission: RE | Admit: 2023-11-13 | Discharge: 2023-11-13 | Disposition: A | Discharge status: Home / Self Care | Payer: Commercial Managed Care - PPO | Source: Ambulatory Visit | Attending: Surgery | Admitting: Surgery

## 2023-11-13 ENCOUNTER — Other Ambulatory Visit: Payer: Self-pay

## 2023-11-13 VITALS — Ht 66.0 in | Wt 215.0 lb

## 2023-11-13 DIAGNOSIS — Z01812 Encounter for preprocedural laboratory examination: Secondary | ICD-10-CM

## 2023-11-13 HISTORY — DX: Nausea with vomiting, unspecified: R11.2

## 2023-11-13 HISTORY — DX: Nausea with vomiting, unspecified: Z98.890

## 2023-11-13 HISTORY — DX: Obesity, class 1: E66.811

## 2023-11-13 NOTE — Patient Instructions (Addendum)
Your procedure is scheduled on: Thursday, February 13 Report to the Registration Desk on the 1st floor of the [facility] To find out your arrival time, please call [phone] between 1PM - 3PM on: Wednesday, February 12 If your arrival time is 6:00 am, do not arrive before that time as the [facility] entrance doors do not open until 6:00 am.  REMEMBER: Instructions that are not followed completely may result in serious medical risk, up to and including death; or upon the discretion of your surgeon and anesthesiologist your surgery may need to be rescheduled.  Do not eat food after midnight the night before surgery.  No gum chewing or hard candies.  You may however, drink CLEAR liquids up to 2 hours before you are scheduled to arrive for your surgery. Do not drink anything within 2 hours of your scheduled arrival time.  Clear liquids include: - water  - apple juice without pulp - gatorade (not RED colors) - black coffee or tea (Do NOT add milk or creamers to the coffee or tea) Do NOT drink anything that is not on this list.  In addition, your doctor has ordered for you to drink the provided:  Ensure Pre-Surgery Clear Carbohydrate Drink  Drinking this carbohydrate drink up to two hours before surgery helps to reduce insulin resistance and improve patient outcomes. Please complete drinking 2 hours before scheduled arrival time.  One week prior to surgery: Stop meloxicam and Anti-inflammatories (NSAIDS) such as Advil, Aleve, Ibuprofen, Motrin, Naproxen, Naprosyn and Aspirin based products such as Excedrin, Goody's Powder, BC Powder. Stop ANY OVER THE COUNTER supplements until after surgery. Stop ashwagandha, ginkgo, glucosamine, B complex.  You may however, continue to take Tylenol if needed for pain up until the day of surgery.  Continue taking all of your other prescription medications up until the day of surgery.  ON THE DAY OF SURGERY ONLY TAKE THESE MEDICATIONS WITH SIPS OF  WATER:  citalopram (CELEXA)   No Alcohol for 24 hours before or after surgery.  No Smoking including e-cigarettes for 24 hours before surgery.  No chewable tobacco products for at least 6 hours before surgery.  No nicotine patches on the day of surgery.  Do not use any "recreational" drugs for at least a week (preferably 2 weeks) before your surgery.  Please be advised that the combination of cocaine and anesthesia may have negative outcomes, up to and including death. If you test positive for cocaine, your surgery will be cancelled.  On the morning of surgery brush your teeth with toothpaste and water, you may rinse your mouth with mouthwash if you wish. Do not swallow any toothpaste or mouthwash.  Use CHG Soap as directed on instruction sheet.  Do not wear jewelry, make-up, hairpins, clips or nail polish.  For welded (permanent) jewelry: bracelets, anklets, waist bands, etc.  Please have this removed prior to surgery.  If it is not removed, there is a chance that hospital personnel will need to cut it off on the day of surgery.  Do not wear lotions, powders, or perfumes.   Do not shave body hair from the neck down 48 hours before surgery.  Contact lenses, hearing aids and dentures may not be worn into surgery.  Do not bring valuables to the hospital. [facility] is not responsible for any missing/lost belongings or valuables.   Notify your doctor if there is any change in your medical condition (cold, fever, infection).  Wear comfortable clothing (specific to your surgery type)  to the hospital.  After surgery, you can help prevent lung complications by doing breathing exercises.  Take deep breaths and cough every 1-2 hours. Your doctor may order a device called an Incentive Spirometer to help you take deep breaths.  If you are being discharged the day of surgery, you will not be allowed to drive home. You will need a responsible individual to drive you home and stay with you  for 24 hours after surgery.   If you are taking public transportation, you will need to have a responsible individual with you.  Please call the Pre-admissions Testing Dept. at [phone] if you have any questions about these instructions.  Surgery Visitation Policy:  Patients having surgery or a procedure may have two visitors.  Children under the age of 43 must have an adult with them who is not the patient.  Temporary Visitor Restrictions Due to increasing cases of flu, RSV and COVID-19: Children ages 109 and under will not be able to visit patients in [facility] hospitals under most circumstances.       Preparing for Surgery with CHLORHEXIDINE GLUCONATE (CHG) Soap  Chlorhexidine Gluconate (CHG) Soap  o An antiseptic cleaner that kills germs and bonds with the skin to continue killing germs even after washing  o Used for showering the night before surgery and morning of surgery  Before surgery, you can play an important role by reducing the number of germs on your skin.  CHG (Chlorhexidine gluconate) soap is an antiseptic cleanser which kills germs and bonds with the skin to continue killing germs even after washing.  Please do not use if you have an allergy to CHG or antibacterial soaps. If your skin becomes reddened/irritated stop using the CHG.  1. Shower the NIGHT BEFORE SURGERY and the MORNING OF SURGERY with CHG soap.  2. If you choose to wash your hair, wash your hair first as usual with your normal shampoo.  3. After shampooing, rinse your hair and body thoroughly to remove the shampoo.  4. Use CHG as you would any other liquid soap. You can apply CHG directly to the skin and wash gently with a scrungie or a clean washcloth.  5. Apply the CHG soap to your body only from the neck down. Do not use on open wounds or open sores. Avoid contact with your eyes, ears, mouth, and genitals (private parts). Wash face and genitals (private parts) with your normal  soap.  6. Wash thoroughly, paying special attention to the area where your surgery will be performed.  7. Thoroughly rinse your body with warm water.  8. Do not shower/wash with your normal soap after using and rinsing off the CHG soap.  9. Pat yourself dry with a clean towel.  10. Wear clean pajamas to bed the night before surgery.  12. Place clean sheets on your bed the night of your first shower and do not sleep with pets.  13. Shower again with the CHG soap on the day of surgery prior to arriving at the hospital.  14. Do not apply any deodorants/lotions/powders.  15. Please wear clean clothes to the hospital.

## 2023-11-14 ENCOUNTER — Other Ambulatory Visit: Payer: Self-pay

## 2023-11-14 ENCOUNTER — Ambulatory Visit: Payer: Commercial Managed Care - PPO | Admitting: Anesthesiology

## 2023-11-14 ENCOUNTER — Encounter
Admission: AD | Disposition: A | Discharge status: Home / Self Care | Payer: Self-pay | Source: Home / Self Care | Attending: Surgery

## 2023-11-14 ENCOUNTER — Encounter: Payer: Self-pay | Admitting: Surgery

## 2023-11-14 ENCOUNTER — Inpatient Hospital Stay: Payer: Commercial Managed Care - PPO

## 2023-11-14 ENCOUNTER — Inpatient Hospital Stay
Admission: AD | Admit: 2023-11-14 | Discharge: 2023-11-15 | DRG: 040 | Disposition: A | Discharge status: Home / Self Care | Payer: Commercial Managed Care - PPO | Attending: Pulmonary Disease | Admitting: Pulmonary Disease

## 2023-11-14 DIAGNOSIS — R031 Nonspecific low blood-pressure reading: Secondary | ICD-10-CM | POA: Diagnosis present

## 2023-11-14 DIAGNOSIS — R000 Tachycardia, unspecified: Secondary | ICD-10-CM | POA: Diagnosis not present

## 2023-11-14 DIAGNOSIS — Z8249 Family history of ischemic heart disease and other diseases of the circulatory system: Secondary | ICD-10-CM | POA: Diagnosis not present

## 2023-11-14 DIAGNOSIS — M5440 Lumbago with sciatica, unspecified side: Secondary | ICD-10-CM

## 2023-11-14 DIAGNOSIS — M542 Cervicalgia: Secondary | ICD-10-CM

## 2023-11-14 DIAGNOSIS — R200 Anesthesia of skin: Secondary | ICD-10-CM

## 2023-11-14 DIAGNOSIS — F419 Anxiety disorder, unspecified: Secondary | ICD-10-CM | POA: Diagnosis present

## 2023-11-14 DIAGNOSIS — I468 Cardiac arrest due to other underlying condition: Secondary | ICD-10-CM | POA: Diagnosis not present

## 2023-11-14 DIAGNOSIS — E669 Obesity, unspecified: Secondary | ICD-10-CM | POA: Diagnosis present

## 2023-11-14 DIAGNOSIS — T4145XA Adverse effect of unspecified anesthetic, initial encounter: Secondary | ICD-10-CM | POA: Diagnosis not present

## 2023-11-14 DIAGNOSIS — Z87891 Personal history of nicotine dependence: Secondary | ICD-10-CM | POA: Diagnosis not present

## 2023-11-14 DIAGNOSIS — M792 Neuralgia and neuritis, unspecified: Secondary | ICD-10-CM

## 2023-11-14 DIAGNOSIS — I469 Cardiac arrest, cause unspecified: Principal | ICD-10-CM | POA: Diagnosis present

## 2023-11-14 DIAGNOSIS — Z6834 Body mass index (BMI) 34.0-34.9, adult: Secondary | ICD-10-CM

## 2023-11-14 DIAGNOSIS — F32A Depression, unspecified: Secondary | ICD-10-CM | POA: Diagnosis present

## 2023-11-14 DIAGNOSIS — Z9049 Acquired absence of other specified parts of digestive tract: Secondary | ICD-10-CM | POA: Diagnosis not present

## 2023-11-14 DIAGNOSIS — R7989 Other specified abnormal findings of blood chemistry: Secondary | ICD-10-CM | POA: Diagnosis present

## 2023-11-14 DIAGNOSIS — R001 Bradycardia, unspecified: Secondary | ICD-10-CM | POA: Diagnosis not present

## 2023-11-14 DIAGNOSIS — Z01812 Encounter for preprocedural laboratory examination: Secondary | ICD-10-CM

## 2023-11-14 DIAGNOSIS — Z604 Social exclusion and rejection: Secondary | ICD-10-CM | POA: Diagnosis present

## 2023-11-14 DIAGNOSIS — Z79899 Other long term (current) drug therapy: Secondary | ICD-10-CM | POA: Diagnosis not present

## 2023-11-14 DIAGNOSIS — R0981 Nasal congestion: Secondary | ICD-10-CM

## 2023-11-14 DIAGNOSIS — M79641 Pain in right hand: Secondary | ICD-10-CM | POA: Diagnosis present

## 2023-11-14 DIAGNOSIS — R052 Subacute cough: Secondary | ICD-10-CM

## 2023-11-14 DIAGNOSIS — R002 Palpitations: Secondary | ICD-10-CM

## 2023-11-14 DIAGNOSIS — G5603 Carpal tunnel syndrome, bilateral upper limbs: Secondary | ICD-10-CM | POA: Diagnosis present

## 2023-11-14 DIAGNOSIS — M549 Dorsalgia, unspecified: Secondary | ICD-10-CM

## 2023-11-14 HISTORY — PX: CARPAL TUNNEL RELEASE: SHX101

## 2023-11-14 HISTORY — DX: Cardiac arrest, cause unspecified: I46.9

## 2023-11-14 LAB — CBC
HCT: 24.8 % — ABNORMAL LOW (ref 36.0–46.0)
HCT: 38.6 % (ref 36.0–46.0)
Hemoglobin: 7.9 g/dL — ABNORMAL LOW (ref 12.0–15.0)
Hemoglobin: 12.9 g/dL (ref 12.0–15.0)
MCH: 33.3 pg (ref 26.0–34.0)
MCH: 33 pg (ref 26.0–34.0)
MCHC: 31.9 g/dL (ref 30.0–36.0)
MCHC: 33.4 g/dL (ref 30.0–36.0)
MCV: 104.6 fL — ABNORMAL HIGH (ref 80.0–100.0)
MCV: 98.7 fL (ref 80.0–100.0)
Platelets: 163 10*3/uL (ref 150–400)
Platelets: 275 10*3/uL (ref 150–400)
RBC: 2.37 MIL/uL — ABNORMAL LOW (ref 3.87–5.11)
RBC: 3.91 MIL/uL (ref 3.87–5.11)
RDW: 11.9 % (ref 11.5–15.5)
RDW: 11.9 % (ref 11.5–15.5)
WBC: 6 10*3/uL (ref 4.0–10.5)
WBC: 14.5 10*3/uL — ABNORMAL HIGH (ref 4.0–10.5)
nRBC: 0 % (ref 0.0–0.2)
nRBC: 0 % (ref 0.0–0.2)

## 2023-11-14 LAB — COMPREHENSIVE METABOLIC PANEL
ALT: 32 U/L (ref 0–44)
AST: 32 U/L (ref 15–41)
Albumin: 3.8 g/dL (ref 3.5–5.0)
Alkaline Phosphatase: 73 U/L (ref 38–126)
Anion gap: 12 (ref 5–15)
BUN: 14 mg/dL (ref 6–20)
CO2: 24 mmol/L (ref 22–32)
Calcium: 8.6 mg/dL — ABNORMAL LOW (ref 8.9–10.3)
Chloride: 104 mmol/L (ref 98–111)
Creatinine, Ser: 0.85 mg/dL (ref 0.44–1.00)
GFR, Estimated: >60 mL/min (ref >60)
Glucose, Bld: 137 mg/dL — ABNORMAL HIGH (ref 70–99)
Potassium: 3.5 mmol/L (ref 3.5–5.1)
Sodium: 140 mmol/L (ref 135–145)
Total Bilirubin: 0.8 mg/dL (ref 0.0–1.2)
Total Protein: 6.6 g/dL (ref 6.5–8.1)

## 2023-11-14 LAB — TROPONIN I (HIGH SENSITIVITY)
Troponin I (High Sensitivity): 3 ng/L (ref <18)
Troponin I (High Sensitivity): 511 ng/L (ref <18)
Troponin I (High Sensitivity): 989 ng/L (ref <18)
Troponin I (High Sensitivity): 1161 ng/L (ref <18)

## 2023-11-14 LAB — URINE DRUG SCREEN, QUALITATIVE (ARMC ONLY)
Amphetamines, Ur Screen: NOT DETECTED
Barbiturates, Ur Screen: NOT DETECTED
Benzodiazepine, Ur Scrn: POSITIVE — AB
Cannabinoid 50 Ng, Ur ~~LOC~~: NOT DETECTED
Cocaine Metabolite,Ur ~~LOC~~: NOT DETECTED
MDMA (Ecstasy)Ur Screen: NOT DETECTED
Methadone Scn, Ur: NOT DETECTED
Opiate, Ur Screen: NOT DETECTED
Phencyclidine (PCP) Ur S: NOT DETECTED
Tricyclic, Ur Screen: NOT DETECTED

## 2023-11-14 LAB — APTT: aPTT: 25 s (ref 24–36)

## 2023-11-14 LAB — URINALYSIS, COMPLETE (UACMP) WITH MICROSCOPIC
Bilirubin Urine: NEGATIVE
Glucose, UA: NEGATIVE mg/dL
Hgb urine dipstick: NEGATIVE
Ketones, ur: NEGATIVE mg/dL
Leukocytes,Ua: NEGATIVE
Nitrite: NEGATIVE
Protein, ur: NEGATIVE mg/dL
Specific Gravity, Urine: 1.008 (ref 1.005–1.030)
WBC, UA: 0 WBC/hpf (ref 0–5)
pH: 6 (ref 5.0–8.0)

## 2023-11-14 LAB — MRSA NEXT GEN BY PCR, NASAL: MRSA by PCR Next Gen: NOT DETECTED

## 2023-11-14 LAB — POCT PREGNANCY, URINE: Preg Test, Ur: NEGATIVE

## 2023-11-14 LAB — LACTIC ACID, PLASMA
Lactic Acid, Venous: 1.6 mmol/L (ref 0.5–1.9)
Lactic Acid, Venous: 3.4 mmol/L (ref 0.5–1.9)

## 2023-11-14 LAB — MAGNESIUM: Magnesium: 1.9 mg/dL (ref 1.7–2.4)

## 2023-11-14 LAB — PROTIME-INR
INR: 1.1 (ref 0.8–1.2)
Prothrombin Time: 14.1 s (ref 11.4–15.2)

## 2023-11-14 LAB — GLUCOSE, CAPILLARY: Glucose-Capillary: 121 mg/dL — ABNORMAL HIGH (ref 70–99)

## 2023-11-14 SURGERY — RELEASE, CARPAL TUNNEL, ENDOSCOPIC
Anesthesia: General | Site: Wrist | Laterality: Right

## 2023-11-14 MED ORDER — DEXTROSE-SODIUM CHLORIDE 5-0.9 % IV SOLN: INTRAVENOUS | Status: AC

## 2023-11-14 MED ORDER — MIDAZOLAM HCL 2 MG/2ML IJ SOLN
INTRAMUSCULAR | Status: AC
  Filled 2023-11-14: qty 2

## 2023-11-14 MED ORDER — LIDOCAINE HCL (CARDIAC) PF 100 MG/5ML IV SOSY
PREFILLED_SYRINGE | INTRAVENOUS | Status: DC | PRN
  Administered 2023-11-14 (×2): 100 mg via INTRAVENOUS

## 2023-11-14 MED ORDER — ATROPINE SULFATE 1 MG/ML IV SOLN
INTRAVENOUS | Status: DC | PRN
  Administered 2023-11-14: 1 mg via INTRAVENOUS

## 2023-11-14 MED ORDER — SCOPOLAMINE 1 MG/3DAYS TD PT72
1.0000 | MEDICATED_PATCH | TRANSDERMAL | Status: DC
  Administered 2023-11-14: 1.5 mg via TRANSDERMAL

## 2023-11-14 MED ORDER — METOCLOPRAMIDE HCL 5 MG/ML IJ SOLN: 5.0000 mg | Freq: Three times a day (TID) | INTRAMUSCULAR | Status: DC | PRN

## 2023-11-14 MED ORDER — LACTATED RINGERS IV SOLN: INTRAVENOUS | Status: DC

## 2023-11-14 MED ORDER — DOPAMINE-DEXTROSE 3.2-5 MG/ML-% IV SOLN
INTRAVENOUS | Status: AC
  Filled 2023-11-14: qty 250

## 2023-11-14 MED ORDER — CHLORHEXIDINE GLUCONATE 0.12 % MT SOLN
OROMUCOSAL | Status: AC
  Filled 2023-11-14: qty 15

## 2023-11-14 MED ORDER — FENTANYL CITRATE (PF) 100 MCG/2ML IJ SOLN
INTRAMUSCULAR | Status: DC | PRN
  Administered 2023-11-14 (×2): 50 ug via INTRAVENOUS

## 2023-11-14 MED ORDER — CHLORHEXIDINE GLUCONATE 0.12 % MT SOLN
15.0000 mL | Freq: Once | OROMUCOSAL | Status: AC
  Administered 2023-11-14: 15 mL via OROMUCOSAL

## 2023-11-14 MED ORDER — ONDANSETRON HCL 4 MG/2ML IJ SOLN
4.0000 mg | Freq: Four times a day (QID) | INTRAMUSCULAR | Status: DC | PRN
Start: 2023-11-14 — End: 2023-11-15

## 2023-11-14 MED ORDER — DOPAMINE-DEXTROSE 1.6-5 MG/ML-% IV SOLN
INTRAVENOUS | Status: DC | PRN
  Administered 2023-11-14: 5 ug/kg/min via INTRAVENOUS

## 2023-11-14 MED ORDER — FENTANYL CITRATE (PF) 100 MCG/2ML IJ SOLN
INTRAMUSCULAR | Status: AC
  Filled 2023-11-14: qty 2

## 2023-11-14 MED ORDER — MIDAZOLAM HCL 2 MG/2ML IJ SOLN
INTRAMUSCULAR | Status: DC | PRN
  Administered 2023-11-14 (×2): 2 mg via INTRAVENOUS

## 2023-11-14 MED ORDER — CITALOPRAM HYDROBROMIDE 10 MG PO TABS: 20.0000 mg | ORAL_TABLET | Freq: Every day | ORAL | Status: DC

## 2023-11-14 MED ORDER — HEPARIN (PORCINE) 25000 UT/250ML-% IV SOLN
1100.0000 [IU]/h | INTRAVENOUS | Status: DC
  Administered 2023-11-14: 1000 [IU]/h via INTRAVENOUS
  Filled 2023-11-14: qty 250

## 2023-11-14 MED ORDER — BUPIVACAINE HCL (PF) 0.5 % IJ SOLN
INTRAMUSCULAR | Status: AC
  Filled 2023-11-14: qty 30

## 2023-11-14 MED ORDER — ACETAMINOPHEN 325 MG PO TABS
325.0000 mg | ORAL_TABLET | Freq: Four times a day (QID) | ORAL | Status: DC | PRN
  Administered 2023-11-15: 650 mg via ORAL
  Filled 2023-11-14: qty 2

## 2023-11-14 MED ORDER — CEFAZOLIN SODIUM-DEXTROSE 2-4 GM/100ML-% IV SOLN
INTRAVENOUS | Status: AC
  Filled 2023-11-14: qty 100

## 2023-11-14 MED ORDER — LACTATED RINGERS IV BOLUS
1000.0000 mL | Freq: Once | INTRAVENOUS | Status: AC
  Administered 2023-11-14: 1000 mL via INTRAVENOUS

## 2023-11-14 MED ORDER — DEXAMETHASONE SODIUM PHOSPHATE 10 MG/ML IJ SOLN
INTRAMUSCULAR | Status: DC | PRN
  Administered 2023-11-14: 4 mg via INTRAVENOUS

## 2023-11-14 MED ORDER — ONDANSETRON HCL 4 MG/2ML IJ SOLN
INTRAMUSCULAR | Status: DC | PRN
  Administered 2023-11-14: 4 mg via INTRAVENOUS

## 2023-11-14 MED ORDER — GABAPENTIN 100 MG PO CAPS: 300.0000 mg | ORAL_CAPSULE | Freq: Every day | ORAL | Status: DC

## 2023-11-14 MED ORDER — IPRATROPIUM BROMIDE 0.03 % NA SOLN: 2.0000 | Freq: Every day | NASAL | Status: DC | PRN

## 2023-11-14 MED ORDER — EPINEPHRINE 1 MG/10ML IJ SOSY
PREFILLED_SYRINGE | INTRAMUSCULAR | Status: DC | PRN
  Administered 2023-11-14 (×2): .5 mg via INTRAVENOUS

## 2023-11-14 MED ORDER — LIDOCAINE HCL (PF) 2 % IJ SOLN
INTRAMUSCULAR | Status: AC
  Filled 2023-11-14: qty 5

## 2023-11-14 MED ORDER — ORAL CARE MOUTH RINSE: 15.0000 mL | Freq: Once | OROMUCOSAL | Status: AC

## 2023-11-14 MED ORDER — POTASSIUM CHLORIDE CRYS ER 20 MEQ PO TBCR
20.0000 meq | EXTENDED_RELEASE_TABLET | Freq: Two times a day (BID) | ORAL | Status: DC
  Administered 2023-11-14: 20 meq via ORAL
  Filled 2023-11-14: qty 1

## 2023-11-14 MED ORDER — HEPARIN BOLUS VIA INFUSION
4000.0000 [IU] | Freq: Once | INTRAVENOUS | Status: AC
Start: 2023-11-15 — End: 2023-11-14
  Administered 2023-11-14: 4000 [IU] via INTRAVENOUS
  Filled 2023-11-14: qty 4000

## 2023-11-14 MED ORDER — LACTATED RINGERS IV BOLUS
500.0000 mL | Freq: Once | INTRAVENOUS | Status: AC
  Administered 2023-11-14: 500 mL via INTRAVENOUS

## 2023-11-14 MED ORDER — CHLORHEXIDINE GLUCONATE CLOTH 2 % EX PADS
6.0000 | MEDICATED_PAD | Freq: Every day | CUTANEOUS | Status: DC
  Administered 2023-11-14 – 2023-11-15 (×2): 6 via TOPICAL

## 2023-11-14 MED ORDER — CEFAZOLIN SODIUM-DEXTROSE 2-4 GM/100ML-% IV SOLN
2.0000 g | INTRAVENOUS | Status: AC
  Administered 2023-11-14: 2 g via INTRAVENOUS

## 2023-11-14 MED ORDER — EPHEDRINE SULFATE-NACL 50-0.9 MG/10ML-% IV SOSY
PREFILLED_SYRINGE | INTRAVENOUS | Status: DC | PRN
  Administered 2023-11-14: 10 mg via INTRAVENOUS
  Administered 2023-11-14: 15 mg via INTRAVENOUS

## 2023-11-14 MED ORDER — PROPOFOL 10 MG/ML IV BOLUS
INTRAVENOUS | Status: AC
  Filled 2023-11-14: qty 20

## 2023-11-14 MED ORDER — PROPOFOL 10 MG/ML IV BOLUS
INTRAVENOUS | Status: DC | PRN
  Administered 2023-11-14: 50 mg via INTRAVENOUS
  Administered 2023-11-14: 100 mg via INTRAVENOUS
  Administered 2023-11-14: 200 mg via INTRAVENOUS
  Administered 2023-11-14: 100 ug/kg/min via INTRAVENOUS

## 2023-11-14 MED ORDER — DEXMEDETOMIDINE HCL IN NACL 200 MCG/50ML IV SOLN
INTRAVENOUS | Status: DC | PRN
  Administered 2023-11-14: 4 ug via INTRAVENOUS

## 2023-11-14 MED ORDER — ONDANSETRON HCL 4 MG PO TABS: 4.0000 mg | ORAL_TABLET | Freq: Four times a day (QID) | ORAL | Status: DC | PRN

## 2023-11-14 MED ORDER — FLUTICASONE PROPIONATE 50 MCG/ACT NA SUSP: 2.0000 | Freq: Every day | NASAL | Status: DC | PRN

## 2023-11-14 MED ORDER — CETIRIZINE HCL 10 MG PO TABS: 10.0000 mg | ORAL_TABLET | Freq: Every day | ORAL | Status: DC | PRN

## 2023-11-14 MED ORDER — 0.9 % SODIUM CHLORIDE (POUR BTL) OPTIME
TOPICAL | Status: DC | PRN
  Administered 2023-11-14: 500 mL

## 2023-11-14 MED ORDER — SCOPOLAMINE 1 MG/3DAYS TD PT72
MEDICATED_PATCH | TRANSDERMAL | Status: AC
  Filled 2023-11-14: qty 1

## 2023-11-14 MED ORDER — BUPIVACAINE HCL (PF) 0.5 % IJ SOLN
INTRAMUSCULAR | Status: DC | PRN
  Administered 2023-11-14: 10 mL

## 2023-11-14 MED ORDER — EPHEDRINE 5 MG/ML INJ
INTRAVENOUS | Status: AC
  Filled 2023-11-14: qty 5

## 2023-11-14 MED ORDER — METOCLOPRAMIDE HCL 5 MG PO TABS: 5.0000 mg | ORAL_TABLET | Freq: Three times a day (TID) | ORAL | Status: DC | PRN

## 2023-11-14 MED ORDER — PROPOFOL 1000 MG/100ML IV EMUL
INTRAVENOUS | Status: AC
  Filled 2023-11-14: qty 100

## 2023-11-14 SURGICAL SUPPLY — 29 items
BNDG COHESIVE 4X5 TAN STRL LF (GAUZE/BANDAGES/DRESSINGS) ×1 IMPLANT
BNDG ELASTIC 2INX 5YD STR LF (GAUZE/BANDAGES/DRESSINGS) ×1 IMPLANT
BNDG ESMARCH 4X12 STRL LF (GAUZE/BANDAGES/DRESSINGS) ×1 IMPLANT
CHLORAPREP W/TINT 26 (MISCELLANEOUS) ×1 IMPLANT
CORD BIP STRL DISP 12FT (MISCELLANEOUS) ×1 IMPLANT
CUFF TOURN SGL QUICK 18X4 (TOURNIQUET CUFF) ×1 IMPLANT
DRAPE SURG 17X11 SM STRL (DRAPES) ×1 IMPLANT
FORCEPS JEWEL BIP 4-3/4 STR (INSTRUMENTS) ×1 IMPLANT
GAUZE SPONGE 4X4 12PLY STRL (GAUZE/BANDAGES/DRESSINGS) ×1 IMPLANT
GAUZE XEROFORM 1X8 LF (GAUZE/BANDAGES/DRESSINGS) ×1 IMPLANT
GLOVE BIO SURGEON STRL SZ8 (GLOVE) ×1 IMPLANT
GLOVE INDICATOR 8.0 STRL GRN (GLOVE) ×1 IMPLANT
GOWN STRL REUS W/ TWL LRG LVL3 (GOWN DISPOSABLE) ×1 IMPLANT
GOWN STRL REUS W/ TWL XL LVL3 (GOWN DISPOSABLE) ×1 IMPLANT
KIT ESCP INSRT D SLOT CANN KN (MISCELLANEOUS) ×1 IMPLANT
KIT TURNOVER KIT A (KITS) ×1 IMPLANT
MANIFOLD NEPTUNE II (INSTRUMENTS) ×1 IMPLANT
NS IRRIG 500ML POUR BTL (IV SOLUTION) ×1 IMPLANT
PACK EXTREMITY ARMC (MISCELLANEOUS) ×1 IMPLANT
SPLINT WRIST LG LT TX990309 (SOFTGOODS) IMPLANT
SPLINT WRIST LG RT TX900304 (SOFTGOODS) IMPLANT
SPLINT WRIST M LT TX990308 (SOFTGOODS) IMPLANT
SPLINT WRIST M RT TX990303 (SOFTGOODS) IMPLANT
SPLINT WRIST XL LT TX990310 (SOFTGOODS) IMPLANT
SPLINT WRIST XL RT TX990305 (SOFTGOODS) IMPLANT
STOCKINETTE IMPERVIOUS 9X36 MD (GAUZE/BANDAGES/DRESSINGS) ×1 IMPLANT
SUT PROLENE 4 0 PS 2 18 (SUTURE) ×1 IMPLANT
TRAP FLUID SMOKE EVACUATOR (MISCELLANEOUS) ×1 IMPLANT
WATER STERILE IRR 500ML POUR (IV SOLUTION) ×1 IMPLANT

## 2023-11-14 NOTE — H&P (Signed)
History of Present Illness:  Christine Banks is a 38 y.o. female who presents for evaluation and treatment of her bilateral hand and wrist pain and paresthesias, right more symptomatic than left. She patient notes that the symptoms have been present for over 7 years and developed without any specific cause or injury. However, she works in Engineering geologist and wonders if the repetitive nature of this work may have contributed to the onset of the symptoms. Her symptoms have worsened over the past 2 years. She has been wearing Velcro splints at night which have provided temporary partial relief of her symptoms. In addition, she has received ultrasound-guided steroid injections into both wrists by Dr. Landry Mellow which provided approximate 1 month worth of relief before her symptoms again recurred. She has been taking ibuprofen and Neurontin on a daily basis with limited benefit. Therefore, she has been referred to me to discuss possible surgical intervention for her bilateral carpal tunnel symptoms.  Current Outpatient Medications:  citalopram (CELEXA) 20 MG tablet Take 20 mg by mouth once daily  cyclobenzaprine (FLEXERIL) 5 MG tablet Take 5 mg by mouth 3 (three) times daily as needed  gabapentin (NEURONTIN) 100 MG capsule Take 200-300mg  nightly 90 capsule 2  ibuprofen (MOTRIN) 200 MG tablet Take by mouth every 6 (six) hours as needed for Fever  tretinoin (RETIN-A) 0.05 % cream Apply topically at bedtime   Allergies: No Known Allergies  Past Medical History: No past medical history on file.  Past Surgical History: No past surgical history on file.   Past Family History: No family history on file.   Social History:   Socioeconomic History:  Marital status: Married  Tobacco Use  Smoking status: Never  Smokeless tobacco: Never  Vaping Use  Vaping status: Never Used  Substance and Sexual Activity  Alcohol use: Defer  Drug use: Defer  Sexual activity: Defer   Social Drivers of Health:   Engineer, maintenance Strain: Low Risk (02/06/2023)  Received from [facility]  Overall Financial Resource Strain (CARDIA)  Difficulty of Paying Living Expenses: Not hard at all  Food Insecurity: No Food Insecurity (02/06/2023)  Received from [facility]  Hunger Vital Sign  Worried About Running Out of Food in the Last Year: Never true  Ran Out of Food in the Last Year: Never true  Transportation Needs: No Transportation Needs (02/06/2023)  Received from [facility] - [facility]  Lack of Transportation (Medical): No  Lack of Transportation (Non-Medical): No   Review of Systems:  A comprehensive 14 point ROS was performed, reviewed, and the pertinent orthopaedic findings are documented in the HPI.  Physical Exam: Vitals:  11/11/23 1336 11/11/23 1338  BP: 120/74  Weight: 98.6 kg (217 lb 6.4 oz)  Height: 167.6 cm (5\' 6" )  PainSc: 5 5  PainLoc: Wrist Wrist   General/Constitutional: The patient appears to be well-nourished, well-developed, and in no acute distress. Neuro/Psych: Normal mood and affect, oriented to person, place and time. Eyes: Non-icteric. Pupils are equal, round, and reactive to light, and exhibit synchronous movement. ENT: Unremarkable. Lymphatic: No palpable adenopathy. Respiratory: Lungs clear to auscultation, Normal chest excursion, No wheezes, and Non-labored breathing Cardiovascular: Regular rate and rhythm. No murmurs. and No edema, swelling or tenderness, except as noted in detailed exam. Integumentary: No impressive skin lesions present, except as noted in detailed exam. Musculoskeletal: Unremarkable, except as noted in detailed exam.  Right wrist/hand exam: Skin inspection of the right wrist and hand is unremarkable. No swelling, erythema, ecchymosis, abrasions, or other  skin abnormalities are identified. She has no tenderness to palpation over the dorsal or volar aspects of the wrist, noted she have any tenderness to palpation over the dorsal or palmar aspects  of her hand. She exhibits full active and passive range of motion of her wrist without any pain or catching. She is able to active flex and extend all digits fully without any pain or triggering. She is able to oppose her thumb to the base of the little finger. She is neurovascularly intact to all digits. She exhibits a positive Phalen's test, as well as an equivocally positive Tinel's over the carpal tunnel.  EMG results:  The results of a recent EMG are available for review and have been reviewed by myself. By report, the study demonstrates evidence of "chronic mild bilateral carpal tunnel syndrome." This report has been reviewed by myself and discussed with the patient.  Assessment: Carpal tunnel syndrome, right.  Plan: The treatment options were discussed with the patient. In addition, patient educational materials were provided regarding the diagnosis and treatment options. The patient is quite frustrated by her symptoms and functional limitations, especially as they pertain to her right wrist. Therefore, I have recommended a surgical procedure, specifically a an endoscopic right carpal tunnel release. The procedure was discussed with the patient, as were the potential risks (including bleeding, infection, nerve and/or blood vessel injury, persistent or recurrent pain/paresthesias, weakness of grip, stiffness of her fingers,, need for further surgery, blood clots, strokes, heart attacks and/or arhythmias, pneumonia, etc.) and benefits. The patient states his/her understanding and wishes to proceed. All of the patient's questions and concerns were answered. She can call any time with further concerns. She will follow up post-surgery, routine. She may continue to work without restrictions.    H&P reviewed and patient re-examined. No changes.

## 2023-11-14 NOTE — H&P (Signed)
NAME:  Casimira Sutphin, MRN:  [id number], DOB:  09-Oct-1985, LOS: 0 ADMISSION DATE:  11/14/2023 History of Present Illness:  Case of a 38 year old female patient with a past medical history of anxiety/depression on citalopram 10 mg daily, chronic neuropathic pain on gabapentin presenting to [facility] on 02/13 for an elective carpal tunnel repair.  She underwent general anesthesia with LMA placement.  As the procedure was finishing and wound being closed patient developed bradycardia with heart rate down to the 30s and reportedly went into asystole requiring few chest compressions and 1 of epi, atropine and started on dopamine drip.  ROSC was obtained after couple of minutes.  LMA was removed and patient was intubated with an ET tube.  She was then transferred to [place] for further monitoring.  On arrival to the ICU patient was noted to be awake alert following commands on 80 mcg of propofol.  I asked the team to shut down propofol.  She was also on dopamine with a heart rate in the 1 teens and therefore asked him to stop the dopamine drip.  Patient was placed on SBT did well and we proceeded with extubation.  After reevaluation in 10 to 15 minutes patient was awake alert oriented x 3.  Her wife at bedside.  She reports no prior cardiac history.  She does have frequent palpitations for which she had been evaluated as outpatient and was told that this is most likely anxiety.  Physical exam  Pertinent  Medical History  Depression/anxiety -Celexa 10 mg daily, Neuropathic pain gabapentin 300 mg at bedtime  Significant Hospital Events: Including procedures, antibiotic start and stop dates in addition to other pertinent events   02/13-admitted to the ICU.  Objective   Blood pressure (!) 79/60, pulse 72, temperature 98 F (36.7 C), temperature source Oral, resp. rate 18, height 5\' 6"  (1.676 m), weight 96.4 kg, last menstrual period 11/08/2023, SpO2 99%.    Vent Mode: PRVC FiO2 (%):  [40 %] 40 % Set Rate:  [18  bmp] 18 bmp Vt Set:  [450 mL] 450 mL PEEP:  [5 cmH20] 5 cmH20   Intake/Output Summary (Last 24 hours) at 11/14/2023 1900 Last data filed at 11/14/2023 1819 Gross per 24 hour  Intake 657.5 ml  Output 0 ml  Net 657.5 ml   Filed Weights   11/14/23 1219 11/14/23 1630  Weight: 97.5 kg 96.4 kg    GEN alert awake oriented x 3. HEENT supple neck, reactive pupils, EOMI CVS normal S1, normal S2, regular rate and rhythm, no murmurs or extra sounds appreciated Lungs clear bilateral air entry Abdomen soft nontender nondistended positive bowel sound Extremities warm well-perfused no edema  Labs were reviewed with potassium of 3.5, normal sodium, CO2 24, BUN 14 and creatinine 0.85 mg/dL.  Chest x-ray normal.  Assessment & Plan:  Case of a 38 year old female patient with a past medical history of anxiety/depression on citalopram 10 mg daily, chronic neuropathic pain on gabapentin presenting to [facility] on 02/13 for an elective carpal tunnel repair.  With course complicated by brief PEA arrest brought into the ICU for further monitoring.  Extubated on arrival.  Stable.  #Peri-General anesthesia PEA arrest unclear reason so far possibly vasovagal.  She was normothermic, no electrolyte abnormalities, denies any chest pain denies any history of arrhythmias.  No signs of hypoxia.  She is saturating well and her tachycardia resolved and therefore PE is less likely.  Her troponin did bump to 500 which could be in the setting of demand.  She is without any chest pain.  EKG with nonspecific T wave changes.  Neuro: No issues CVS: Tele Monitor, echocardiogram in the a.m.  Electrolytes within normal range.  Pending mag.  Trend troponin.  Discussed with surgeon regarding heparin drip initiation as well as aspirin and statin.  Consult cardiology in the a.m. Pulmonary: No issues GI: No issues.  Regular diet. Endo: Insulin POC's 1 40-1 80 Heme: DVT prophylaxis  Best Practice (right click and "Reselect all  SmartList Selections" daily)   Diet/type: Regular consistency (see orders) DVT prophylaxis: SCD GI prophylaxis: N/A Lines: N/A Foley:  N/A Code Status:  full code  I spent 60 minutes caring for this patient today, including preparing to see the patient, obtaining a medical history , reviewing a separately obtained history, performing a medically appropriate examination and/or evaluation, ordering medications, tests, or procedures, documenting clinical information in the electronic health record, and independently interpreting results (not separately reported/billed) and communicating results to the patient/family/caregiver  Janann Colonel, MD [facility] 11/14/2023 7:29 PM

## 2023-11-14 NOTE — Anesthesia Preprocedure Evaluation (Addendum)
Anesthesia Evaluation  Patient identified by MRN, date of birth, ID band Patient awake    Reviewed: Allergy & Precautions, NPO status , Patient's Chart, lab work & pertinent test results  History of Anesthesia Complications (+) PONV and history of anesthetic complications  Airway Mallampati: I   Neck ROM: Full    Dental no notable dental hx.    Pulmonary former smoker (quit 10 years ago)   Pulmonary exam normal breath sounds clear to auscultation       Cardiovascular Exercise Tolerance: Good Normal cardiovascular exam Rhythm:Regular Rate:Normal  ECG 01/11/23: Sinus rhythm   Neuro/Psych  PSYCHIATRIC DISORDERS Anxiety     negative neurological ROS     GI/Hepatic negative GI ROS,,,  Endo/Other  Obesity   Renal/GU negative Renal ROS     Musculoskeletal   Abdominal   Peds  Hematology negative hematology ROS (+)   Anesthesia Other Findings   Reproductive/Obstetrics                             Anesthesia Physical Anesthesia Plan  ASA: 2  Anesthesia Plan: General   Post-op Pain Management:    Induction: Intravenous  PONV Risk Score and Plan: 3 and Ondansetron, Dexamethasone, Treatment may vary due to age or medical condition and Scopolamine patch - Pre-op  Airway Management Planned: LMA  Additional Equipment:   Intra-op Plan:   Post-operative Plan: Extubation in OR  Informed Consent: I have reviewed the patients History and Physical, chart, labs and discussed the procedure including the risks, benefits and alternatives for the proposed anesthesia with the patient or authorized representative who has indicated his/her understanding and acceptance.     Dental advisory given  Plan Discussed with: CRNA  Anesthesia Plan Comments: (Patient consented for risks of anesthesia including but not limited to:  - adverse reactions to medications - damage to eyes, teeth, lips or other oral  mucosa - nerve damage due to positioning  - sore throat or hoarseness - damage to heart, brain, nerves, lungs, other parts of body or loss of life  Informed patient about role of CRNA in peri- and intra-operative care.  Patient voiced understanding.)       Anesthesia Quick Evaluation

## 2023-11-14 NOTE — Op Note (Signed)
11/14/2023  4:07 PM  Patient:   Christine Banks  Pre-Op Diagnosis:   Right carpal tunnel syndrome.  Post-Op Diagnosis:   Same.  Procedure:   Endoscopic right carpal tunnel release.  Surgeon:   Maryagnes Amos, MD  Anesthesia:   General LMA  Findings:   As above.  Complications:   None  EBL:   5 cc  Fluids:   300 cc crystalloid  TT:   14 minutes at 250 mmHg  Drains:   None  Closure:   4-0 Prolene interrupted sutures  Brief Clinical Note:   The patient is a 38 year old female with a long history of progressively worsening pain and paresthesias to her right hand. Her symptoms have progressed despite medications, activity modification, splinting, injections, etc. Her history and examination are consistent with carpal tunnel syndrome confirmed by EMG. The patient presents at this time for an endoscopic right carpal tunnel release.   Procedure:   The patient was brought into the operating room and lain in the supine position. After adequate general laryngeal mask anesthesia was obtained, the right hand and upper extremity were prepped with ChloraPrep solution before being draped sterilely. Preoperative antibiotics were administered. A timeout was performed to verify the appropriate surgical site before the limb was exsanguinated with an Esmarch and the tourniquet inflated to 250 mmHg.   An approximately 1.5-2 cm incision was made over the volar wrist flexion crease, centered over the palmaris longus tendon. The incision was carried down through the subcutaneous tissues with care taken to identify and protect any neurovascular structures. The distal forearm fascia was penetrated just proximal to the transverse carpal ligament. The soft tissues were released off the superficial and deep surfaces of the distal forearm fascia and this was released proximally for 3-4 cm under direct visualization.  Attention was directed distally. The Therapist, nutritional was passed beneath the transverse carpal  ligament along the ulnar aspect of the carpal tunnel and used to release any adhesions as well as to remove any adherent synovial tissue before first the smaller then the larger of the two dilators were passed beneath the transverse carpal ligament along the ulnar margin of the carpal tunnel. The slotted cannula was introduced and the endoscope was placed into the slotted cannula and the undersurface of the transverse carpal ligament visualized. The distal margin of the transverse carpal ligament was marked by placing a 25-gauge needle percutaneously at Kaplan's cardinal point so that it entered the distal portion of the slotted cannula. Under endoscopic visualization, the transverse carpal ligament was released from proximal to distal using the end-cutting blade. A second pass was performed to ensure complete release of the ligament. The adequacy of release was verified both endoscopically and by palpation using the freer elevator.  The wound was irrigated thoroughly with sterile saline solution before being closed using 4-0 Prolene interrupted sutures. A total of 10 cc of 0.5% plain Sensorcaine was injected in and around the incision before a sterile bulky dressing was applied to the wound. The patient was placed into a volar wrist splint.  As the wound was being closed, the patient inexplicably developed bradycardia which rapidly developed into asystole.  A rapid response was called and the anesthesia team responded.  She appeared to be stabilizing nicely before she again went into bradycardia and then went into a tachycardic rhythm with elevated blood pressure.  At this time, it was elected to intubate the patient and transfer her to the intensive care unit for close monitoring and further  evaluation and treatment.

## 2023-11-14 NOTE — Progress Notes (Signed)
Date and time results received: 11/14/23 1904  Test: troponin Critical Value: 511  Name of Provider Notified: Harlon Ditty NP

## 2023-11-14 NOTE — Progress Notes (Signed)
Pt arrived to unit at this time. Pt is intubated and on a propofol and dopamine gtt. Pt is awake and gagging on ETT. Propofol and Dopamine stopped per MD order. Pt is following commands. Order received to extubate. Pt extubated by RT at 1639 and placed on 2L . Pt tolerated well and is alert and oriented X4. Pt is sinus tach on the monitor, 12 lead EKG performed at this time. All other VSS. Family member brought to bedside and updated.

## 2023-11-14 NOTE — Transfer of Care (Signed)
Immediate Anesthesia Transfer of Care Note  Patient: Christine Banks  Procedure(s) Performed: CARPAL TUNNEL RELEASE ENDOSCOPIC (Right: Wrist)  Patient Location: PACU and ICU  Anesthesia Type:General  Level of Consciousness: sedated  Airway & Oxygen Therapy: Patient Spontanous Breathing and Patient remains intubated per anesthesia plan  Post-op Assessment: Report given to RN and Post -op Vital signs reviewed and stable  Post vital signs: stable  Last Vitals:  Vitals Value Taken Time  BP 112/74   Temp 96   Pulse 127 11/14/23 1636  Resp 21 11/14/23 1636  SpO2 100 % 11/14/23 1636  Vitals shown include unfiled device data.  Last Pain:  Vitals:   11/14/23 1219  TempSrc: Oral  PainSc: 3          Complications: No notable events documented.

## 2023-11-14 NOTE — Consult Note (Signed)
PHARMACY - ANTICOAGULATION CONSULT NOTE  Pharmacy Consult for Heparin Indication: chest pain/ACS  No Known Allergies  Patient Measurements: Height: 5\' 6"  (167.6 cm) Weight: 96.4 kg (212 lb 8.4 oz) IBW/kg (Calculated) : 59.3 Heparin Dosing Weight: 80.8 kg  Vital Signs: Temp: 97.6 F (36.4 C) (02/13 2030) Temp Source: Oral (02/13 2030) BP: 93/71 (02/13 2230) Pulse Rate: 75 (02/13 2230)  Labs: Recent Labs    11/14/23 1554 11/14/23 1657 11/14/23 1812 11/14/23 2027 11/14/23 2143  HGB 7.9* 12.9  --   --   --   HCT 24.8* 38.6  --   --   --   PLT 163 275  --   --   --   CREATININE  --  0.85  --   --   --   TROPONINIHS 3  --  511* 989* 1,161*    Estimated Creatinine Clearance: 103.9 mL/min (by C-G formula based on SCr of 0.85 mg/dL).   Medical History: Past Medical History:  Diagnosis Date   Anxiety    Carpal tunnel syndrome, bilateral 11/2023   Obesity (BMI 30.0-34.9)    PONV (postoperative nausea and vomiting)     Medications:  No history of chronic anticoagulant use PTA  Assessment: 38 year old female patient with a past medical history of anxiety/depression on citalopram 10 mg daily, chronic neuropathic pain on gabapentin presenting to [facility] on 02/13 for an elective carpal tunnel repair. As the procedure was finishing and wound being closed patient developed bradycardia with heart rate down to the 30s and reportedly went into asystole requiring few chest compressions and 1 of epi, atropine and started on dopamine drip.  ROSC was obtained after couple of minutes.  LMA was removed and patient was intubated with an ET tube and transferred to ICU. Found to have troponin levels of [phone]. Pharmacy consulted to initiate and monitor continuous heparin infusion.  Baseline labs: aPTT 25 sec, INR 1.1, hgb 12.9, Plts 275  Goal of Therapy:  Heparin level 0.3-0.7 units/ml Monitor platelets by anticoagulation protocol: Yes   Plan:  Give 4000 units bolus x 1 Start  heparin infusion at 1000 units/hr Check anti-Xa level in 6 hours and daily while on heparin Continue to monitor H&H and platelets  Lekeya Rollings A Jenniger Figiel 11/14/2023,11:07 PM

## 2023-11-14 NOTE — Plan of Care (Signed)
Cardiac arrest, extubated Troponin increasing  Will start heparin, trend trops ORTHO cleared patient cleared for anticoagulation

## 2023-11-14 NOTE — Anesthesia Procedure Notes (Signed)
Procedure Name: Intubation Date/Time: 11/14/2023 4:10 PM  Performed by: Darrell Jewel I, CRNAPre-anesthesia Checklist: Patient identified, Patient being monitored, Timeout performed, Emergency Drugs available and Suction available Patient Re-evaluated:Patient Re-evaluated prior to induction Oxygen Delivery Method: Circle system utilized Preoxygenation: Pre-oxygenation with 100% oxygen Induction Type: IV induction Ventilation: Mask ventilation without difficulty Laryngoscope Size: Mac, 3 and McGrath Grade View: Grade I Tube type: Oral Tube size: 7.0 mm Number of attempts: 1 Airway Equipment and Method: Stylet Placement Confirmation: ETT inserted through vocal cords under direct vision, positive ETCO2 and breath sounds checked- equal and bilateral Secured at: 21 cm Tube secured with: Tape Dental Injury: Teeth and Oropharynx as per pre-operative assessment

## 2023-11-14 NOTE — Discharge Instructions (Signed)
Orthopedic discharge instructions: Keep dressing dry and intact. Keep hand elevated above heart level. May shower after dressing removed on postop day 4 (Monday). Cover sutures with Band-Aids after drying off, then reapply Velcro splint. Apply ice to affected area frequently. Take ibuprofen 600-800 mg TID with meals for 3-5 days, then as necessary. Take ES Tylenol when needed.  Return for follow-up in 10-14 days or as scheduled.

## 2023-11-14 NOTE — Anesthesia Postprocedure Evaluation (Signed)
Anesthesia Post Note  Patient: Christine Banks  Procedure(s) Performed: CARPAL TUNNEL RELEASE ENDOSCOPIC (Right: Wrist)  Patient location during evaluation: ICU Anesthesia Type: General Level of consciousness: patient remains intubated per anesthesia plan Pain management: pain level controlled Vital Signs Assessment: vitals unstable Respiratory status: patient on ventilator - see flowsheet for VS Cardiovascular status: blood pressure returned to baseline Anesthetic complications: yes Comments: Patient remains intubated and sedated for further workup of bradycardia.   Encounter Notable Events  Notable Event Outcome Phase Comment  Cardiac arrest Resolved in Lab Intraprocedure Bradycardia with brief asystole with quick ROSC     Last Vitals:  Vitals:   11/14/23 1219 11/14/23 1630  BP: (!) 140/87 112/72  Pulse: 65 (!) 123  Resp: 16 (!) 22  Temp: (!) 36.3 C 36.7 C  SpO2: 100% 100%    Last Pain:  Vitals:   11/14/23 1630  TempSrc: Oral  PainSc:                  Reed Breech

## 2023-11-14 NOTE — Progress Notes (Signed)
Received pt from OR being bagged by CRNA. Pt placed on ventilator and tube holder placed to secure ETT. Dr Larinda Buttery arrived to room verbally requesting to extubate pt. Pt extubated to 2Lnc.

## 2023-11-15 ENCOUNTER — Inpatient Hospital Stay
Admission: AD | Admit: 2023-11-15 | Discharge: 2023-11-15 | Disposition: A | Discharge status: Home / Self Care | Payer: Commercial Managed Care - PPO | Source: Home / Self Care | Attending: Pulmonary Disease | Admitting: Pulmonary Disease

## 2023-11-15 ENCOUNTER — Encounter: Payer: Self-pay | Admitting: Surgery

## 2023-11-15 ENCOUNTER — Encounter
Admission: AD | Disposition: A | Discharge status: Home / Self Care | Payer: Self-pay | Source: Home / Self Care | Attending: Surgery

## 2023-11-15 DIAGNOSIS — I469 Cardiac arrest, cause unspecified: Secondary | ICD-10-CM | POA: Diagnosis not present

## 2023-11-15 HISTORY — PX: LEFT HEART CATH AND CORONARY ANGIOGRAPHY: CATH118249

## 2023-11-15 LAB — CBC
HCT: 36.8 % (ref 36.0–46.0)
Hemoglobin: 12.4 g/dL (ref 12.0–15.0)
MCH: 33.2 pg (ref 26.0–34.0)
MCHC: 33.7 g/dL (ref 30.0–36.0)
MCV: 98.4 fL (ref 80.0–100.0)
Platelets: 264 10*3/uL (ref 150–400)
RBC: 3.74 MIL/uL — ABNORMAL LOW (ref 3.87–5.11)
RDW: 12.1 % (ref 11.5–15.5)
WBC: 12.1 10*3/uL — ABNORMAL HIGH (ref 4.0–10.5)
nRBC: 0 % (ref 0.0–0.2)

## 2023-11-15 LAB — BASIC METABOLIC PANEL
Anion gap: 11 (ref 5–15)
BUN: 12 mg/dL (ref 6–20)
CO2: 21 mmol/L — ABNORMAL LOW (ref 22–32)
Calcium: 8.6 mg/dL — ABNORMAL LOW (ref 8.9–10.3)
Chloride: 104 mmol/L (ref 98–111)
Creatinine, Ser: 0.7 mg/dL (ref 0.44–1.00)
GFR, Estimated: >60 mL/min (ref >60)
Glucose, Bld: 152 mg/dL — ABNORMAL HIGH (ref 70–99)
Potassium: 4 mmol/L (ref 3.5–5.1)
Sodium: 136 mmol/L (ref 135–145)

## 2023-11-15 LAB — ECHOCARDIOGRAM COMPLETE
AR max vel: 2.38 cm2
AV Area VTI: 2.6 cm2
AV Area mean vel: 2.17 cm2
AV Mean grad: 4 mm[Hg]
AV Peak grad: 7 mm[Hg]
Ao pk vel: 1.32 m/s
Area-P 1/2: 4.06 cm2
Calc EF: 52.6 %
Height: 66 in
MV VTI: 3 cm2
S' Lateral: 4.1 cm
Single Plane A2C EF: 52.8 %
Single Plane A4C EF: 54 %
Weight: 3400.38 [oz_av]

## 2023-11-15 LAB — TROPONIN I (HIGH SENSITIVITY)
Troponin I (High Sensitivity): 759 ng/L (ref <18)
Troponin I (High Sensitivity): 701 ng/L (ref <18)

## 2023-11-15 LAB — LACTIC ACID, PLASMA
Lactic Acid, Venous: 2.5 mmol/L (ref 0.5–1.9)
Lactic Acid, Venous: 1.5 mmol/L (ref 0.5–1.9)

## 2023-11-15 LAB — HEPARIN LEVEL (UNFRACTIONATED)
Heparin Unfractionated: 0.32 [IU]/mL (ref 0.30–0.70)
Heparin Unfractionated: 0.2 [IU]/mL — ABNORMAL LOW (ref 0.30–0.70)

## 2023-11-15 LAB — MAGNESIUM: Magnesium: 2 mg/dL (ref 1.7–2.4)

## 2023-11-15 LAB — CARDIAC CATHETERIZATION: Cath EF Quantitative: 50 %

## 2023-11-15 SURGERY — LEFT HEART CATH AND CORONARY ANGIOGRAPHY
Anesthesia: Moderate Sedation

## 2023-11-15 MED ORDER — FENTANYL CITRATE (PF) 100 MCG/2ML IJ SOLN
INTRAMUSCULAR | Status: AC
  Filled 2023-11-15: qty 2

## 2023-11-15 MED ORDER — SODIUM CHLORIDE 0.9 % WEIGHT BASED INFUSION
3.0000 mL/kg/h | INTRAVENOUS | Status: DC
Start: 2023-11-15 — End: 2023-11-15
  Administered 2023-11-15: 3 mL/kg/h via INTRAVENOUS

## 2023-11-15 MED ORDER — ASPIRIN 81 MG PO CHEW: 81.0000 mg | CHEWABLE_TABLET | ORAL | Status: DC

## 2023-11-15 MED ORDER — HEPARIN (PORCINE) IN NACL 1000-0.9 UT/500ML-% IV SOLN
INTRAVENOUS | Status: DC | PRN
  Administered 2023-11-15 (×2): 500 mL

## 2023-11-15 MED ORDER — HEPARIN (PORCINE) IN NACL 1000-0.9 UT/500ML-% IV SOLN
INTRAVENOUS | Status: AC
Start: 2023-11-15 — End: ?
  Filled 2023-11-15: qty 1000

## 2023-11-15 MED ORDER — SODIUM CHLORIDE 0.9 % WEIGHT BASED INFUSION: 1.0000 mL/kg/h | INTRAVENOUS | Status: DC

## 2023-11-15 MED ORDER — MIDAZOLAM HCL 2 MG/2ML IJ SOLN
INTRAMUSCULAR | Status: DC | PRN
  Administered 2023-11-15: 2 mg via INTRAVENOUS

## 2023-11-15 MED ORDER — ASPIRIN 81 MG PO TBEC
81.0000 mg | DELAYED_RELEASE_TABLET | Freq: Every day | ORAL | Status: DC
  Administered 2023-11-15: 81 mg via ORAL
  Filled 2023-11-15: qty 1

## 2023-11-15 MED ORDER — SODIUM CHLORIDE 0.9 % IV SOLN: 250.0000 mL | INTRAVENOUS | Status: DC | PRN

## 2023-11-15 MED ORDER — LIDOCAINE HCL (PF) 1 % IJ SOLN
INTRAMUSCULAR | Status: DC | PRN
  Administered 2023-11-15: 20 mL

## 2023-11-15 MED ORDER — ATORVASTATIN CALCIUM 80 MG PO TABS
80.0000 mg | ORAL_TABLET | Freq: Every day | ORAL | Status: DC
  Administered 2023-11-15: 80 mg via ORAL
  Filled 2023-11-15: qty 1

## 2023-11-15 MED ORDER — IOHEXOL 300 MG/ML  SOLN
INTRAMUSCULAR | Status: DC | PRN
  Administered 2023-11-15: 70 mL

## 2023-11-15 MED ORDER — MIDAZOLAM HCL 2 MG/2ML IJ SOLN
INTRAMUSCULAR | Status: AC
  Filled 2023-11-15: qty 2

## 2023-11-15 MED ORDER — FENTANYL CITRATE PF 50 MCG/ML IJ SOSY
12.5000 ug | PREFILLED_SYRINGE | INTRAMUSCULAR | Status: DC | PRN
  Administered 2023-11-15: 12.5 ug via INTRAVENOUS
  Filled 2023-11-15: qty 1

## 2023-11-15 SURGICAL SUPPLY — 12 items
CATH INFINITI 5FR MULTPACK ANG (CATHETERS) IMPLANT
DEVICE CLOSURE MYNXGRIP 5F (Vascular Products) IMPLANT
DRAPE BRACHIAL (DRAPES) IMPLANT
NDL PERC 18GX7CM (NEEDLE) IMPLANT
NEEDLE PERC 18GX7CM (NEEDLE) ×1 IMPLANT
PACK ANGIOGRAPHY (CUSTOM PROCEDURE TRAY) IMPLANT
PACK CARDIAC CATH (CUSTOM PROCEDURE TRAY) ×1 IMPLANT
PROTECTION STATION PRESSURIZED (MISCELLANEOUS) ×1 IMPLANT
SET ATX-X65L (MISCELLANEOUS) IMPLANT
SHEATH AVANTI 5FR X 11CM (SHEATH) IMPLANT
STATION PROTECTION PRESSURIZED (MISCELLANEOUS) IMPLANT
WIRE GUIDERIGHT .035X150 (WIRE) IMPLANT

## 2023-11-15 NOTE — Plan of Care (Signed)

## 2023-11-15 NOTE — Plan of Care (Signed)

## 2023-11-15 NOTE — Consult Note (Signed)
PHARMACY - ANTICOAGULATION CONSULT NOTE  Pharmacy Consult for Heparin Indication: chest pain/ACS  No Known Allergies  Patient Measurements: Height: 5\' 6"  (167.6 cm) Weight: 96.4 kg (212 lb 8.4 oz) IBW/kg (Calculated) : 59.3 Heparin Dosing Weight: 80.8 kg  Vital Signs: Temp: 97.6 F (36.4 C) (02/14 0400) Temp Source: Oral (02/14 0400) BP: 98/67 (02/14 0500) Pulse Rate: 60 (02/14 0500)  Labs: Recent Labs    11/14/23 1554 11/14/23 1657 11/14/23 1812 11/14/23 2027 11/14/23 2143 11/14/23 2315 11/15/23 0620  HGB 7.9* 12.9  --   --   --   --   --   HCT 24.8* 38.6  --   --   --   --   --   PLT 163 275  --   --   --   --   --   APTT  --   --   --   --   --  25  --   LABPROT  --   --   --   --   --  14.1  --   INR  --   --   --   --   --  1.1  --   HEPARINUNFRC  --   --   --   --   --   --  0.32  CREATININE  --  0.85  --   --   --   --   --   TROPONINIHS 3  --  511* 989* 1,161*  --   --     Estimated Creatinine Clearance: 103.9 mL/min (by C-G formula based on SCr of 0.85 mg/dL).   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 As the procedure was finishing and wound being closed patient developed bradycardia with heart rate down to the 30s and reportedly went into asystole requiring few chest compressions and 1 of epi, atropine and started on dopamine drip.  ROSC was obtained after couple of minutes.  LMA was removed and patient was intubated with an ET tube and transferred to ICU. Found to have troponin levels of [phone]. Pharmacy consulted to initiate  and monitor continuous heparin infusion.  Baseline labs: aPTT 25 sec, INR 1.1, hgb 12.9, Plts 275  Goal of Therapy:  Heparin level 0.3-0.7 units/ml Monitor platelets by anticoagulation protocol: Yes   02/14@0620 : HL 0.32, therapeutic x 1  Plan:  Will increase heparin infusion slightly to 1100 units/hr as patient's lab value is on the lower end of goal Check anti-Xa level in 6 hours and daily while on heparin Continue to monitor H&H and platelets  Alveta Quintela A Marshe Shrestha 11/15/2023,6:42 AM

## 2023-11-15 NOTE — Discharge Summary (Signed)
Physician Discharge Summary  Patient ID: Christine Banks MRN: [id number] DOB/AGE: 1986/04/04 38 y.o.  Admit date: 11/14/2023 Discharge date: 11/15/2023   Brief Pt Description / Synopsis:  38 year old female patient with a past medical history of anxiety/depression on citalopram 10 mg daily, chronic neuropathic pain on gabapentin presenting to [facility] on 02/13 for an elective carpal tunnel repair. With course complicated by brief PEA arrest brought into the ICU for further monitoring. Extubated on arrival.    Discharge Diagnoses:   Peri-General anesthesia PEA arrest  Elevated Troponin, suspect demand ischemia Right Endoscopic Carpal Tunnel Release                                                            Discharge Summary:  Case of a 38 year old female patient with a past medical history of anxiety/depression on citalopram 10 mg daily, chronic neuropathic pain on gabapentin presenting to [facility] on 02/13 for an elective carpal tunnel repair.   She underwent general anesthesia with LMA placement.  As the procedure was finishing and wound being closed patient developed bradycardia with heart rate down to the 30s and reportedly went into asystole requiring few chest compressions and 1 of epi, atropine and started on dopamine drip.  ROSC was obtained after couple of minutes.  LMA was removed and patient was intubated with an ET tube.  She was then transferred to [place] for further monitoring.   On arrival to the ICU patient was noted to be awake alert following commands on 80 mcg of propofol.  I asked the team to shut down propofol.  She was also on dopamine with a heart rate in the 1 teens and therefore asked him to stop the dopamine drip.  Patient was placed on SBT did well and we proceeded with extubation.   After reevaluation in 10 to 15 minutes patient was awake alert oriented x 3.  Her wife at bedside.  She reports no prior cardiac history.  She does have frequent palpitations for which she had been  evaluated as outpatient and was told that this is most likely anxiety.  Please see "Significant Hospital Events" section below for full detailed hospital course.   Discharge Plan by Diagnosis:   #Peri-General Anesthesia PEA Cardiac Arrest, exact etiology unclear, possibly vasovagal vs reaction from anesthesia #Elevated Troponin, suspect demand ischemia Cardiac Catheterization 11/15/23:  Large left main free of disease, LAD free of disease, Circumflex free of disease, RCA free of disease, mild Left ventricular systolic dysfunction (LVEF 50-55%) Echocardiogram 11/15/23: LVEF 50-55%, Grade II Diastolic dysfunction, RV systolic function mildly reduced, RV size mildly enlarged -Follow up with Cardiology outpatient for consideration of Cardiac MRI and for consideration of Heart Failure clinic evaluation due to slightly enlarged LV and low normal LVEF of 50% -No need for antiplatelet therapy currently per Cardiology -HS Troponin peaked at 1,161  #Right Endoscopic Carpal Tunnel Release -Elevated and ice the right wrist -WBAT to the right hand -Alternate between tylenol and ibuprofen for pain -Can remove dressing on Monday and shower -Follow up with Orthopedics as previously instructed    Significant Events:  2/13: Underwent elective right endoscopic Carpal Tunnel Release surgery.  Became bradycardic and subsequent brief PEA Cardiac arrest.  Intubated and brought to ICU.  Extubated shortly after ICU arrival.  Placed on Heparin gtt due to  uptrending troponin.   2/14: Cardiology consulted, went for Cardiac Cath, no significant CAD found.  Discharge home with plan for follow up with Cardiology outpatient.                 Significant Diagnostic Studies:  2/13: Chest X-ray >>IMPRESSION: No active disease. 2/14: Echocardiogram>>IMPRESSIONS  1. Consider cardiac MRI.  2. LVE Diastolic dysfunction Low normal EF=50-55%.  3. Left ventricular ejection fraction, by estimation, is 50 to 55%. The  left  ventricle has low normal function. The left ventricle has no regional  wall motion abnormalities. The left ventricular internal cavity size was  moderately dilated. Left  ventricular diastolic parameters are consistent with Grade II diastolic  dysfunction (pseudonormalization). The average left ventricular global  longitudinal strain is 14.6 %. The global longitudinal strain is abnormal.   4. Right ventricular systolic function is mildly reduced. The right  ventricular size is mildly enlarged.   5. The mitral valve is normal in structure. Trivial mitral valve  regurgitation.   6. The aortic valve is normal in structure. Aortic valve regurgitation is  not visualized.  2/14: Cardiac Catheterization>>Coronaries: Large left main free of disease, LAD large free of disease, Circumflex large free of disease, RCA large free of disease CONCLUSIONS:   There is mild left ventricular systolic dysfunction, LV end diastolic pressure is normal, The left ventricular ejection fraction is 50-55% by visual estimate., There is no mitral valve regurgitation, No indication for antiplatelet therapy at this time .            Micro Data:  2/13: MRSA PCR>> negative   Antimicrobials:   Anti-infectives (From admission, onward)    Start     Dose/Rate Route Frequency Ordered Stop   11/15/23 0600  ceFAZolin (ANCEF) IVPB 2g/100 mL premix        2 g 200 mL/hr over 30 Minutes Intravenous On call to O.R. 11/14/23 1203 11/14/23 1524        Consults:  Orthopedics PCCM Cardiology   Discharge Exam:   GEN alert awake oriented x 3. HEENT supple neck, reactive pupils, EOMI CVS normal S1, normal S2, regular rate and rhythm, no murmurs or extra sounds appreciated Lungs clear bilateral air entry Abdomen soft nontender nondistended positive bowel sound Extremities warm well-perfused no edema  Vitals:   11/15/23 1551 11/15/23 1600 11/15/23 1615 11/15/23 1630  BP:  108/74 107/78 107/71  Pulse: 66 68 68 64   Resp: 20 18 13 19   Temp:      TempSrc:      SpO2: 97% 98% 100% 99%  Weight:      Height:         Discharge Labs:   BMET Recent Labs  Lab 11/14/23 1657 11/14/23 2027 11/15/23 0628 11/15/23 0845  NA 140  --  136  --   K 3.5  --  4.0  --   CL 104  --  104  --   CO2 24  --  21*  --   GLUCOSE 137*  --  152*  --   BUN 14  --  12  --   CREATININE 0.85  --  0.70  --   CALCIUM 8.6*  --  8.6*  --   MG  --  1.9  --  2.0    CBC Recent Labs  Lab 11/14/23 1554 11/14/23 1657 11/15/23 0620  HGB 7.9* 12.9 12.4  HCT 24.8* 38.6 36.8  WBC 6.0 14.5* 12.1*  PLT 163 275 264  Anti-Coagulation Recent Labs  Lab 11/14/23 2315  INR 1.1        Follow-up Information     Alwyn Pea, MD. Go in 3 day(s).   Specialties: Cardiology, Internal Medicine Why: Follow-up Tuesday, February 18 at [facility] at 8 AM Patient will be seen by heart failure and [facility] Contact information: [address] [place] [address] [phone]                  Allergies as of 11/15/2023   No Known Allergies      Medication List     STOP taking these medications    meloxicam 15 MG tablet Commonly known as: MOBIC       TAKE these medications    acetaminophen 500 MG tablet Commonly known as: TYLENOL Take 1,000 mg by mouth every 6 (six) hours as needed for mild pain (pain score 1-3), moderate pain (pain score 4-6) or headache (Cramps).   ASHWAGANDHA PO Take 600 mg by mouth daily. KSA 56   cetirizine 10 MG tablet Commonly known as: ZYRTEC Take 1 tablet (10 mg total) by mouth daily as needed.   citalopram 10 MG tablet Commonly known as: CELEXA Take 20 mg by mouth daily. What changed: Another medication with the same name was changed. Make sure you understand how and when to take each.   citalopram 10 MG tablet Commonly known as: CELEXA Take 2 tablets (20 mg total) by mouth daily. Take 1 tab daily for a week and add a second tab if symptoms  persist What changed: See the new instructions.   cyclobenzaprine 5 MG tablet Commonly known as: FLEXERIL Take 1 tablet (5 mg total) by mouth 3 (three) times daily as needed for muscle spasms.   Dapsone 5 % topical gel Apply 1 Application topically daily as needed (Break-out on face).   fluticasone 50 MCG/ACT nasal spray Commonly known as: FLONASE Place 2 sprays into both nostrils daily as needed.   gabapentin 100 MG capsule Commonly known as: NEURONTIN Take 300 mg by mouth at bedtime. What changed: Another medication with the same name was changed. Make sure you understand how and when to take each.   gabapentin 100 MG capsule Commonly known as: NEURONTIN Take 3 capsules (300 mg total) by mouth at bedtime. What changed: See the new instructions.   Ginkgo Biloba 120 MG Tabs Take 120 mg by mouth daily.   GLUCOSAMINE 1500 COMPLEX PO Take 1,500 mg by mouth daily.   hydrOXYzine 10 MG tablet Commonly known as: ATARAX Take 1 tablet (10 mg total) by mouth 3 (three) times daily as needed.   ibuprofen 200 MG tablet Commonly known as: ADVIL Take 800 mg by mouth every 6 (six) hours as needed for moderate pain (pain score 4-6) or mild pain (pain score 1-3).   ipratropium 0.03 % nasal spray Commonly known as: ATROVENT Place 2 sprays into both nostrils daily as needed for up to 3 days for rhinitis.   SUPER B COMPLEX/C PO Take 1 tablet by mouth daily.   tretinoin 0.05 % cream Commonly known as: RETIN-A Apply 1 application  topically 3 (three) times a week.            Disposition: Home  Follow Up with Cardiology (Dr. Juliann Pares) in 1 to 2 weeks  Discharged Condition: Kodee Drury has met maximum benefit of inpatient care and is medically stable and cleared for discharge.  Patient is pending follow up as above.  Time spent on disposition:  45 Minutes.     Signed: Harlon Ditty, AGACNP-BC [facility] Prefer epic messenger for cross cover  needs If after hours, please call E-link

## 2023-11-15 NOTE — Progress Notes (Signed)
NAME:  Christine Banks, MRN:  [id number], DOB:  05/16/1986, LOS: 1 ADMISSION DATE:  11/14/2023 History of Present Illness:  Case of a 38 year old female patient with a past medical history of anxiety/depression on citalopram 10 mg daily, chronic neuropathic pain on gabapentin presenting to [facility] on 02/13 for an elective carpal tunnel repair.  She underwent general anesthesia with LMA placement.  As the procedure was finishing and wound being closed patient developed bradycardia with heart rate down to the 30s and reportedly went into asystole requiring few chest compressions and 1 of epi, atropine and started on dopamine drip.  ROSC was obtained after couple of minutes.  LMA was removed and patient was intubated with an ET tube.  She was then transferred to [place] for further monitoring.  On arrival to the ICU patient was noted to be awake alert following commands on 80 mcg of propofol.  I asked the team to shut down propofol.  She was also on dopamine with a heart rate in the 1 teens and therefore asked him to stop the dopamine drip.  Patient was placed on SBT did well and we proceeded with extubation.  After reevaluation in 10 to 15 minutes patient was awake alert oriented x 3.  Her wife at bedside.  She reports no prior cardiac history.  She does have frequent palpitations for which she had been evaluated as outpatient and was told that this is most likely anxiety.   Pertinent  Medical History  Depression/anxiety -Celexa 10 mg daily, Neuropathic pain gabapentin 300 mg at bedtime  Significant Hospital Events: Including procedures, antibiotic start and stop dates in addition to other pertinent events   02/13-admitted to the ICU.  Subjective  Overnight Troponin uptrending - most recent at 1100. Heparin drip initiated.  This AM appears well without any major complaints.   Objective   Blood pressure 113/68, pulse 74, temperature 98.7 F (37.1 C), temperature source Oral, resp. rate 18, height 5\' 6"   (1.676 m), weight 96.4 kg, last menstrual period 11/08/2023, SpO2 97%.    Vent Mode: PRVC FiO2 (%):  [40 %] 40 % Set Rate:  [18 bmp] 18 bmp Vt Set:  [450 mL] 450 mL PEEP:  [5 cmH20] 5 cmH20   Intake/Output Summary (Last 24 hours) at 11/15/2023 0835 Last data filed at 11/15/2023 1191 Gross per 24 hour  Intake 1815.52 ml  Output 1600 ml  Net 215.52 ml   Filed Weights   11/14/23 1219 11/14/23 1630  Weight: 97.5 kg 96.4 kg    GEN alert awake oriented x 3. HEENT supple neck, reactive pupils, EOMI CVS normal S1, normal S2, regular rate and rhythm, no murmurs or extra sounds appreciated Lungs clear bilateral air entry Abdomen soft nontender nondistended positive bowel sound Extremities warm well-perfused no edema  Labs were reviewed with potassium of 3.5, normal sodium, CO2 24, BUN 14 and creatinine 0.85 mg/dL.  Trop 500 --> 900 --> 1100  Chest x-ray normal.  Assessment & Plan:  Case of a 38 year old female patient with a past medical history of anxiety/depression on citalopram 10 mg daily, chronic neuropathic pain on gabapentin presenting to [facility] on 02/13 for an elective carpal tunnel repair.  With course complicated by brief PEA arrest brought into the ICU for further monitoring.  Extubated on arrival.  Stable.  #Peri-General anesthesia PEA arrest unclear reason so far possibly vasovagal.  She was normothermic, no electrolyte abnormalities, denies any chest pain denies any history of arrhythmias.  No signs of hypoxia.  She  is saturating well and her tachycardia resolved and therefore PE is less likely.  Her troponin did bump to 500 which could be in the setting of demand. Continued to uptrend overnight with most recent 1100.  She is without any chest pain.  EKG with nonspecific T wave changes.  Neuro: No issues CVS: Tele Monitor, echocardiogram.  Electrolytes within normal range.Trend troponin.  Cardiology consult. Heparin drip. Aspirin 81 and atorvastatin 80mg .  Pulmonary: No  issues GI: No issues.  Regular diet. Endo: Insulin POC's 140-180 Heme: Heparin drip.   Best Practice (right click and "Reselect all SmartList Selections" daily)   Diet/type: Regular consistency (see orders) DVT prophylaxis: Heparin drip.  GI prophylaxis: N/A Lines: N/A Foley:  N/A Code Status:  full code  I spent 50 minutes caring for this patient today, including preparing to see the patient, obtaining a medical history , reviewing a separately obtained history, performing a medically appropriate examination and/or evaluation, ordering medications, tests, or procedures, documenting clinical information in the electronic health record, and independently interpreting results (not separately reported/billed) and communicating results to the patient/family/caregiver  Janann Colonel, MD [facility] 11/15/2023 8:35 AM

## 2023-11-15 NOTE — Progress Notes (Signed)
Patient discharged home with significant other. Patient belongings kept at bedside sent with patient. VSS. AxOx4. Patient provided with discharge teaching and sent home with discharge instructions.

## 2023-11-15 NOTE — Progress Notes (Signed)
Patient has remained clinically stable post heart cath per Dr Juliann Pares, tolerated well. SO has remained at bedside with patient post procedure.no bleeding nor hematoma at right groin site. Report given to care nurse Laney Pastor post recovery/ICU 12. Taking po's without difficulty.

## 2023-11-15 NOTE — Consult Note (Addendum)
PHARMACY - ANTICOAGULATION CONSULT NOTE  Pharmacy Consult for Heparin Indication: chest pain/ACS  No Known Allergies  Patient Measurements: Height: 5\' 6"  (167.6 cm) Weight: 96.4 kg (212 lb 8.4 oz) IBW/kg (Calculated) : 59.3 Heparin Dosing Weight: 80.8 kg  Vital Signs: Temp: 98 F (36.7 C) (02/14 1139) Temp Source: Oral (02/14 1139) BP: 97/54 (02/14 1139) Pulse Rate: 74 (02/14 1139)  Labs: Recent Labs    11/14/23 1554 11/14/23 1657 11/14/23 1812 11/14/23 2143 11/14/23 2315 11/15/23 0620 11/15/23 0628 11/15/23 0845 11/15/23 1023 11/15/23 1247  HGB 7.9* 12.9  --   --   --   --   --   --   --   --   HCT 24.8* 38.6  --   --   --   --   --   --   --   --   PLT 163 275  --   --   --   --   --   --   --   --   APTT  --   --   --   --  25  --   --   --   --   --   LABPROT  --   --   --   --  14.1  --   --   --   --   --   INR  --   --   --   --  1.1  --   --   --   --   --   HEPARINUNFRC  --   --   --   --   --  0.32  --   --   --  0.20*  CREATININE  --  0.85  --   --   --   --  0.70  --   --   --   TROPONINIHS 3  --    < > 1,161*  --   --   --  759* 701*  --    < > = values in this interval not displayed.    Estimated Creatinine Clearance: 110.4 mL/min (by C-G formula based on SCr of 0.7 mg/dL).   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 As the procedure was finishing and wound being closed patient developed bradycardia with heart rate down to the 30s and reportedly went into asystole requiring few chest compressions and 1 of epi, atropine and  started on dopamine drip.  ROSC was obtained after couple of minutes.  LMA was removed and patient was intubated with an ET tube and transferred to ICU. Found to have troponin levels of [phone]. Pharmacy consulted to initiate and monitor continuous heparin infusion.  Baseline labs: aPTT 25 sec, INR 1.1, hgb 12.9, Plts 275  Goal of Therapy:  Heparin level 0.3-0.7 units/ml Monitor platelets by anticoagulation protocol: Yes   02/14@0620 : HL 0.32, therapeutic x 1 02/14@1247 : HL 0.20, SUBtherapeutic   No interruptions per nurse  Plan:  Heparin subtherapeutic Patient went to cath lab for procedure, will follow plan afterwards if restarting heparin or if  patient will transition to DVT ppx.  They are not on home anticoagulation  Continue to monitor H&H and platelets daily if they remain on heparin   Effie Shy, PharmD Pharmacy Resident  11/15/2023 1:31 PM

## 2023-11-15 NOTE — Consult Note (Signed)
CARDIOLOGY CONSULT NOTE               Patient ID: Christine Banks MRN: [id number] DOB/AGE: 1986/02/07 38 y.o.  Admit date: 11/14/2023 Referring Physician Janann Colonel MD critical care Primary Physician Dr. Joice Lofts Primary Cardiologist  Reason for Consultation elevated troponins PEA arrest  HPI: Patient is a 38 year old female anxiety depression undergoing carpal tunnel release had an episode where the patient underwent significant profound bradycardia reportedly went asystolic had few compressions of CODE BLUE received epi atropine and started on dopamine drip before restoration of R0SC.  Patient was intubated sedated for short time and subsequently denies any previous cardiac history no chest pain now had elevated troponins cardiology was consulted EKG was nondiagnostic and benign it is unclear what happened to her but I do not believe this is primary cardiac but we will proceed with a limited cardiac evaluation to exclude cardiac causes for this event  Review of systems complete and found to be negative unless listed above     Past Medical History:  Diagnosis Date   Anxiety    Carpal tunnel syndrome, bilateral 11/2023   Obesity (BMI 30.0-34.9)    PONV (postoperative nausea and vomiting)     Past Surgical History:  Procedure Laterality Date   CARPAL TUNNEL RELEASE Right 11/14/2023   Procedure: CARPAL TUNNEL RELEASE ENDOSCOPIC;  Surgeon: Christena Flake, MD;  Location: [facility] ORS;  Service: Orthopedics;  Laterality: Right;   CHOLECYSTECTOMY  2016   PILONIDAL CYST EXCISION  2014    Medications Prior to Admission  Medication Sig Dispense Refill Last Dose/Taking   acetaminophen (TYLENOL) 500 MG tablet Take 1,000 mg by mouth every 6 (six) hours as needed for mild pain (pain score 1-3), moderate pain (pain score 4-6) or headache (Cramps).   11/12/2023   ASHWAGANDHA PO Take 600 mg by mouth daily. KSA 56   11/12/2023   citalopram (CELEXA) 10 MG tablet Take 20 mg by mouth daily.    11/12/2023   cyclobenzaprine (FLEXERIL) 5 MG tablet Take 1 tablet (5 mg total) by mouth 3 (three) times daily as needed for muscle spasms. 30 tablet 1 Past Month   Dapsone 5 % topical gel Apply 1 Application topically daily as needed (Break-out on face).   11/13/2023   gabapentin (NEURONTIN) 100 MG capsule Take 300 mg by mouth at bedtime.   11/12/2023   Ginkgo Biloba 120 MG TABS Take 120 mg by mouth daily.   11/12/2023   Glucosamine-Chondroit-Vit C-Mn (GLUCOSAMINE 1500 COMPLEX PO) Take 1,500 mg by mouth daily.   11/12/2023   hydrOXYzine (ATARAX) 10 MG tablet Take 1 tablet (10 mg total) by mouth 3 (three) times daily as needed. 30 tablet 0 Taking As Needed   ibuprofen (ADVIL) 200 MG tablet Take 800 mg by mouth every 6 (six) hours as needed for moderate pain (pain score 4-6) or mild pain (pain score 1-3).   Past Week   meloxicam (MOBIC) 15 MG tablet Take 15 mg by mouth daily.   11/12/2023   SUPER B COMPLEX/C PO Take 1 tablet by mouth daily.   11/12/2023   tretinoin (RETIN-A) 0.05 % cream Apply 1 application  topically 3 (three) times a week.   Past Week   [DISCONTINUED] citalopram (CELEXA) 10 MG tablet TAKE 1 TAB DAILY FOR A WEEK AND ADD A SECOND TAB IF SYMPTOMS PERSIST (Patient taking differently: Take 20 mg by mouth daily. Take 1 tab daily for a week and add a second tab if symptoms persist) 180  tablet 1 11/12/2023   [DISCONTINUED] gabapentin (NEURONTIN) 100 MG capsule TAKE 1 CAPSULE (100 MG TOTAL) BY MOUTH THREE TIMES DAILY. (Patient taking differently: Take 300 mg by mouth at bedtime.) 90 capsule 3 11/12/2023   [DISCONTINUED] ipratropium (ATROVENT) 0.03 % nasal spray Place 2 sprays into both nostrils every 12 (twelve) hours for 3 days. (Patient taking differently: Place 2 sprays into both nostrils daily as needed for rhinitis.) 30 mL 0 Taking Differently   [DISCONTINUED] cetirizine (ZYRTEC) 10 MG tablet Take 1 tablet (10 mg total) by mouth daily. (Patient taking differently: Take 10 mg by mouth daily as  needed.) 30 tablet 11 Not Taking   [DISCONTINUED] fluticasone (FLONASE) 50 MCG/ACT nasal spray Place 2 sprays into both nostrils daily. (Patient taking differently: Place 2 sprays into both nostrils daily as needed.) 16 g 6 Not Taking   Social History   Socioeconomic History   Marital status: Married    Spouse name: Stacie   Number of children: 1   Years of education: Not on file   Highest education level: Some college, no degree  Occupational History   Not on file  Tobacco Use   Smoking status: Former    Types: Cigarettes   Smokeless tobacco: Never  Vaping Use   Vaping status: Never Used  Substance and Sexual Activity   Alcohol use: Yes    Comment: occassional   Drug use: Never   Sexual activity: Yes    Birth control/protection: Other-see comments, None    Comment: same sex marriage  Other Topics Concern   Not on file  Social History Narrative   Not on file   Social Drivers of Health   Financial Resource Strain: Low Risk  (11/11/2023)   Received from [facility]   Overall Financial Resource Strain (CARDIA)    Difficulty of Paying Living Expenses: Not hard at all  Food Insecurity: No Food Insecurity (11/14/2023)   Hunger Vital Sign    Worried About Running Out of Food in the Last Year: Never true    Ran Out of Food in the Last Year: Never true  Transportation Needs: No Transportation Needs (11/14/2023)   PRAPARE - Administrator, Civil Service (Medical): No    Lack of Transportation (Non-Medical): No  Physical Activity: Insufficiently Active (02/06/2023)   Exercise Vital Sign    Days of Exercise per Week: 3 days    Minutes of Exercise per Session: 20 min  Stress: Stress Concern Present (02/06/2023)   [employer] of Occupational Health - Occupational Stress Questionnaire    Feeling of Stress : Rather much  Social Connections: Socially Isolated (02/06/2023)   Social Connection and Isolation Panel [NHANES]    Frequency of Communication with  Friends and Family: Once a week    Frequency of Social Gatherings with Friends and Family: Once a week    Attends Religious Services: Never    Database administrator or Organizations: No    Attends Engineer, structural: Not on file    Marital Status: Married  Catering manager Violence: Not At Risk (11/14/2023)   Humiliation, Afraid, Rape, and Kick questionnaire    Fear of Current or Ex-Partner: No    Emotionally Abused: No    Physically Abused: No    Sexually Abused: No    Family History  Problem Relation Age of Onset   Hyperthyroidism Mother    Arthritis Mother    Arthritis Father    Hypertension Father    Lung cancer  Maternal Grandmother    Cancer - Lung Maternal Grandfather    Lung cancer Paternal Grandfather       Review of systems complete and found to be negative unless listed above      PHYSICAL EXAM  General: Well developed, well nourished, in no acute distress HEENT:  Normocephalic and atramatic Neck:  No JVD.  Lungs: Clear bilaterally to auscultation and percussion. Heart: HRRR . Normal S1 and S2 without gallops or murmurs.  Abdomen: Bowel sounds are positive, abdomen soft and non-tender  Msk:  Back normal, normal gait. Normal strength and tone for age. Extremities: No clubbing, cyanosis or edema.   Neuro: Alert and oriented X 3. Psych:  Good affect, responds appropriately  Labs:   Lab Results  Component Value Date   WBC 14.5 (H) 11/14/2023   HGB 12.9 11/14/2023   HCT 38.6 11/14/2023   MCV 98.7 11/14/2023   PLT 275 11/14/2023    Recent Labs  Lab 11/14/23 1657 11/15/23 0628  NA 140 136  K 3.5 4.0  CL 104 104  CO2 24 21*  BUN 14 12  CREATININE 0.85 0.70  CALCIUM 8.6* 8.6*  PROT 6.6  --   BILITOT 0.8  --   ALKPHOS 73  --   ALT 32  --   AST 32  --   GLUCOSE 137* 152*   No results found for: "CKTOTAL", "CKMB", "CKMBINDEX", "TROPONINI"  Lab Results  Component Value Date   CHOL 132 02/13/2022   Lab Results  Component Value Date    HDL 63 02/13/2022   Lab Results  Component Value Date   LDLCALC 57 02/13/2022   Lab Results  Component Value Date   TRIG 54 02/13/2022   Lab Results  Component Value Date   CHOLHDL 2.1 02/13/2022   No results found for: "LDLDIRECT"    Radiology: [facility] Chest Port 1 View Result Date: 11/14/2023 CLINICAL DATA:  Collapse due to cardiac arrest EXAM: PORTABLE CHEST 1 VIEW COMPARISON:  None available FINDINGS: Heart and mediastinal contours are within normal limits. No focal opacities or effusions. No acute bony abnormality. Defibrillator pads noted on the left chest. IMPRESSION: No active disease. Electronically Signed   By: Charlett Nose M.D.   On: 11/14/2023 20:52    EKG: Sinus tachycardia rate of 118 nonspecific ST-T wave changes  ASSESSMENT AND PLAN:  PEA arrest Bradycardia during anesthesia Reported asystolic arrest Short course of CODE BLUE Status post mechanical ventilation History of palpitations Elevated troponin Transient hypotension Mild obesity . Plan Critical care management for asystolic PEA arrest now with elevated troponins unclear etiology whether this is a demand ischemic event from reaction to anesthesia versus a true non-STEMI This probably is demand ischemia would recommend definitive cardiac cath to rule out any significant cardiac disease or coronary dissection Echocardiogram for evaluation of left ventricular function Will base further management on results of the studies    Signed: Alwyn Pea MD 11/15/2023, 11:57 AM

## 2023-11-15 NOTE — Progress Notes (Signed)
  Subjective: POD1 Right Endoscopic carpal tunnel release Patient reports pain as mild in the right wrist. Patient is doing well pertaining to her right wrist, was admitted due to brief PEA arrest during her procedure. Admitted to the ICU for further evaluation. Negative for chest pain and shortness of breath Fever: no Gastrointestinal:Negative for nausea and vomiting  Objective: Vital signs in last 24 hours: Temp:  [97.6 F (36.4 C)-98.7 F (37.1 C)] 98.5 F (36.9 C) (02/14 1409) Pulse Rate:  [60-119] 68 (02/14 1615) Resp:  [13-26] 13 (02/14 1615) BP: (74-117)/(54-79) 107/78 (02/14 1615) SpO2:  [95 %-100 %] 100 % (02/14 1615) FiO2 (%):  [40 %] 40 % (02/13 1640)  Intake/Output from previous day:  Intake/Output Summary (Last 24 hours) at 11/15/2023 1631 Last data filed at 11/15/2023 1353 Gross per 24 hour  Intake 1775.58 ml  Output 1601 ml  Net 174.58 ml    Intake/Output this shift: Total I/O In: 1017.9 [I.V.:1017.9] Out: 1 [Urine:1]  Labs: Recent Labs    11/14/23 1554 11/14/23 1657 11/15/23 0620  HGB 7.9* 12.9 12.4   Recent Labs    11/14/23 1657 11/15/23 0620  WBC 14.5* 12.1*  RBC 3.91 3.74*  HCT 38.6 36.8  PLT 275 264   Recent Labs    11/14/23 1657 11/15/23 0628  NA 140 136  K 3.5 4.0  CL 104 104  CO2 24 21*  BUN 14 12  CREATININE 0.85 0.70  GLUCOSE 137* 152*  CALCIUM 8.6* 8.6*   Recent Labs    11/14/23 2315  INR 1.1     EXAM General - Patient is Alert and Appropriate ACE wrap and velcro wrist splint applied to the right wrist. Moderate swelling noted to the right hand. Able to flex and extend fingers. Numbness improving to the right wrist.  Past Medical History:  Diagnosis Date   Anxiety    Carpal tunnel syndrome, bilateral 11/2023   Obesity (BMI 30.0-34.9)    PONV (postoperative nausea and vomiting)     Assessment/Plan: * Day of Surgery * Procedure(s) (LRB): LEFT HEART CATH AND CORONARY ANGIOGRAPHY with possible PCI and stent  (N/A) Principal Problem:   Cardiac arrest (HCC)  Estimated body mass index is 34.3 kg/m as calculated from the following:   Height as of this encounter: 5\' 6"  (1.676 m).   Weight as of this encounter: 96.4 kg.  Continue to elevate and ice the right wrist. WBAT to the right hand. Can remove dressing on Monday and shower.  DVT Prophylaxis -  Heparin  Valeria Batman, PA-C [facility] 11/15/2023, 4:31 PM

## 2023-11-15 NOTE — Progress Notes (Addendum)
Went to ICU to consent patient for heart cath. Patient refusing at this time. Had questions about her general care that I was unable to answer. Sent message to both [facility] and ICU MD [place] to notify them and ask for someone to follow up with patient to answer questions. Also notified ICU bedside RN.

## 2023-11-15 NOTE — Progress Notes (Signed)
PHARMACY CONSULT NOTE - FOLLOW UP  Pharmacy Consult for Electrolyte Monitoring and Replacement   Recent Labs: Potassium (mmol/L)  Date Value  11/14/2023 3.5   Magnesium (mg/dL)  Date Value  16/07/9603 1.9   Calcium (mg/dL)  Date Value  54/06/8118 8.6 (L)   Albumin (g/dL)  Date Value  14/78/2956 3.8  01/11/2023 4.4   Sodium (mmol/L)  Date Value  11/14/2023 140  01/11/2023 140   Assessment: EP is a 38 yo female who presented to [facility] for elective carpal tunnel repair. They went into cardiac arrest at the end of the surgery and was intubated. Pharmacy is monitoring this patient's electrolytes while in the ICU.   Goal of Therapy:  Electrolytes WNL (K > 4; Mg >2 given recent cardiac event) K = 4 K = 2  Plan:  No replacement indicated at this time  Ordered BMP, Mg with AM labs   Effie Shy, PharmD Pharmacy Resident  11/15/2023 8:56 AM

## 2023-11-15 NOTE — Progress Notes (Signed)
*  PRELIMINARY RESULTS* Echocardiogram 2D Echocardiogram has been performed.  Carolyne Fiscal 11/15/2023, 2:09 PM

## 2023-11-16 LAB — TRYPTASE: Tryptase: 2.8 ug/L (ref 2.2–13.2)

## 2023-11-17 ENCOUNTER — Encounter: Payer: Self-pay | Admitting: Physician Assistant

## 2023-11-17 LAB — THYROID PANEL WITH TSH
Free Thyroxine Index: 2.3 (ref 1.2–4.9)
T3 Uptake Ratio: 29 % (ref 24–39)
T4, Total: 7.8 ug/dL (ref 4.5–12.0)
TSH: 1.27 u[IU]/mL (ref 0.450–4.500)

## 2023-11-18 ENCOUNTER — Encounter: Payer: Self-pay | Admitting: Internal Medicine

## 2023-11-21 LAB — AMPHETAMINES/MDMA,MS,WB/SP RFX
Amphetamine: NEGATIVE ng/mL
Amphetamines Confirmation: NEGATIVE
MDA: NEGATIVE ng/mL
MDEA: NEGATIVE ng/mL
MDMA: NEGATIVE ng/mL
Methamphetamine: NEGATIVE ng/mL

## 2023-11-21 LAB — PHENCYCLIDINE, MS, WB/SP RFX
Phencyclidine Confirmation: NEGATIVE
Phencyclidine: NEGATIVE ng/mL

## 2023-11-21 LAB — COCAINE,MS,WB/SP RFX
Benzoylecgonine: NEGATIVE ng/mL
Cocaine Confirmation: NEGATIVE
Cocaine: NEGATIVE ng/mL

## 2023-11-22 LAB — OPIATES,MS,WB/SP RFX
6-Acetylmorphine: NEGATIVE
Codeine: NEGATIVE ng/mL
Dihydrocodeine: NEGATIVE ng/mL
Hydrocodone: NEGATIVE ng/mL
Hydromorphone: NEGATIVE ng/mL
Morphine: NEGATIVE ng/mL
Opiate Confirmation: NEGATIVE

## 2023-11-22 LAB — METHADONE,MS,WB/SP RFX
Methadone Confirmation: NEGATIVE
Methadone: NEGATIVE ng/mL

## 2023-11-23 LAB — DRUG SCREEN 10 W/CONF, SERUM
Amphetamines, IA: NEGATIVE ng/mL
Barbiturates, IA: NEGATIVE ug/mL
Benzodiazepines, IA: POSITIVE ng/mL — AB
Cocaine & Metabolite, IA: NEGATIVE ng/mL
Methadone, IA: NEGATIVE ng/mL
Opiates, IA: NEGATIVE ng/mL
Oxycodones, IA: NEGATIVE ng/mL
Phencyclidine, IA: NEGATIVE ng/mL
Propoxyphene, IA: NEGATIVE ng/mL
THC(Marijuana) Metabolite, IA: NEGATIVE ng/mL

## 2023-11-23 LAB — BENZODIAZEPINES,MS,WB/SP RFX
7-Aminoclonazepam: NEGATIVE ng/mL
Alprazolam: NEGATIVE ng/mL
Benzodiazepines Confirm: POSITIVE
Chlordiazepoxide: NEGATIVE
Clonazepam: NEGATIVE ng/mL
Desalkylflurazepam: NEGATIVE ng/mL
Desmethylchlordiazepoxide: NEGATIVE
Desmethyldiazepam: NEGATIVE ng/mL
Diazepam: NEGATIVE ng/mL
Flurazepam: NEGATIVE ng/mL
Lorazepam: NEGATIVE ng/mL
Midazolam: 18.4 ng/mL
Oxazepam: NEGATIVE ng/mL
Temazepam: NEGATIVE ng/mL
Triazolam: NEGATIVE ng/mL

## 2023-11-29 ENCOUNTER — Encounter: Payer: Self-pay | Admitting: Physician Assistant

## 2023-11-29 ENCOUNTER — Ambulatory Visit: Payer: Commercial Managed Care - PPO | Admitting: Physician Assistant

## 2023-11-29 VITALS — BP 113/76 | HR 69 | Ht 66.0 in | Wt 217.0 lb

## 2023-11-29 DIAGNOSIS — F419 Anxiety disorder, unspecified: Secondary | ICD-10-CM | POA: Diagnosis not present

## 2023-11-29 DIAGNOSIS — E66811 Obesity, class 1: Secondary | ICD-10-CM | POA: Diagnosis not present

## 2023-11-29 DIAGNOSIS — I469 Cardiac arrest, cause unspecified: Secondary | ICD-10-CM

## 2023-11-29 DIAGNOSIS — Z9889 Other specified postprocedural states: Secondary | ICD-10-CM | POA: Diagnosis not present

## 2023-11-29 DIAGNOSIS — F339 Major depressive disorder, recurrent, unspecified: Secondary | ICD-10-CM

## 2023-11-29 MED ORDER — SERTRALINE HCL 25 MG PO TABS: 25.0000 mg | ORAL_TABLET | Freq: Every day | ORAL | 1 refills | Status: DC

## 2023-11-29 NOTE — Progress Notes (Unsigned)
 Established patient visit  Patient: Christine Banks   DOB: January 02, 1986   38 y.o. Female  MRN: [id number] Visit Date: 11/29/2023  Today's healthcare provider: Debera Lat, PA-C   Chief Complaint  Patient presents with   medication    Discuss starting her  Anxiety medication possible different brand, weight loss meds,   Subjective     HPI     medication    Additional comments: Discuss starting her  Anxiety medication possible different brand, weight loss meds,      Last edited by Thedora Hinders, CMA on 11/29/2023  8:30 AM.       Discussed the use of AI scribe software for clinical note transcription with the patient, who gave verbal consent to proceed.  History of Present Illness               11/29/2023    8:31 AM 10/08/2023    3:10 PM 06/05/2023    8:20 AM  Depression screen PHQ 2/9  Decreased Interest 1 1 0  Down, Depressed, Hopeless 0 1 0  PHQ - 2 Score 1 2 0  Altered sleeping 2 1   Tired, decreased energy 2 1   Change in appetite 3 1   Feeling bad or failure about yourself  1 0   Trouble concentrating 1 1   Moving slowly or fidgety/restless 0 0   Suicidal thoughts 0 0   PHQ-9 Score 10 6   Difficult doing work/chores Not difficult at all        11/29/2023    8:31 AM 10/08/2023    3:12 PM 04/09/2023    9:35 AM 01/11/2023    3:18 PM  GAD 7 : Generalized Anxiety Score  Nervous, Anxious, on Edge 2 1 2 3   Control/stop worrying 2 0 1 3  Worry too much - different things 2 1 3 3   Trouble relaxing 1 0 1 2  Restless 0 0 0 1  Easily annoyed or irritable 1 0 2 2  Afraid - awful might happen 1 0 0 0  Total GAD 7 Score 9 2 9 14   Anxiety Difficulty Somewhat difficult  Not difficult at all Somewhat difficult    Medications: Outpatient Medications Prior to Visit  Medication Sig   acetaminophen (TYLENOL) 500 MG tablet Take 1,000 mg by mouth every 6 (six) hours as needed for mild pain (pain score 1-3), moderate pain (pain score 4-6) or headache (Cramps).   Dapsone 5 %  topical gel Apply 1 Application topically daily as needed (Break-out on face).   ibuprofen (ADVIL) 200 MG tablet Take 800 mg by mouth every 6 (six) hours as needed for moderate pain (pain score 4-6) or mild pain (pain score 1-3).   tretinoin (RETIN-A) 0.05 % cream Apply 1 application  topically 3 (three) times a week.   ASHWAGANDHA PO Take 600 mg by mouth daily. KSA 85 (Patient not taking: Reported on 11/29/2023)   cetirizine (ZYRTEC) 10 MG tablet Take 1 tablet (10 mg total) by mouth daily as needed. (Patient not taking: Reported on 11/29/2023)   cyclobenzaprine (FLEXERIL) 5 MG tablet Take 1 tablet (5 mg total) by mouth 3 (three) times daily as needed for muscle spasms. (Patient not taking: Reported on 11/29/2023)   fluticasone (FLONASE) 50 MCG/ACT nasal spray Place 2 sprays into both nostrils daily as needed. (Patient not taking: Reported on 11/29/2023)   gabapentin (NEURONTIN) 100 MG capsule Take 3 capsules (300 mg total) by mouth at bedtime. (Patient not taking: Reported on  11/29/2023)   gabapentin (NEURONTIN) 100 MG capsule Take 300 mg by mouth at bedtime. (Patient not taking: Reported on 11/29/2023)   Ginkgo Biloba 120 MG TABS Take 120 mg by mouth daily. (Patient not taking: Reported on 11/29/2023)   Glucosamine-Chondroit-Vit C-Mn (GLUCOSAMINE 1500 COMPLEX PO) Take 1,500 mg by mouth daily. (Patient not taking: Reported on 11/29/2023)   hydrOXYzine (ATARAX) 10 MG tablet Take 1 tablet (10 mg total) by mouth 3 (three) times daily as needed. (Patient not taking: Reported on 11/29/2023)   ipratropium (ATROVENT) 0.03 % nasal spray Place 2 sprays into both nostrils daily as needed for up to 3 days for rhinitis.   SUPER B COMPLEX/C PO Take 1 tablet by mouth daily. (Patient not taking: Reported on 11/29/2023)   [DISCONTINUED] citalopram (CELEXA) 10 MG tablet Take 2 tablets (20 mg total) by mouth daily. Take 1 tab daily for a week and add a second tab if symptoms persist (Patient not taking: Reported on 11/29/2023)    [DISCONTINUED] citalopram (CELEXA) 10 MG tablet Take 20 mg by mouth daily. (Patient not taking: Reported on 11/29/2023)   No facility-administered medications prior to visit.    Review of Systems All negative Except see HPI   {Insert previous labs (optional):23779} {See past labs  Heme  Chem  Endocrine  Serology  Results Review (optional):1}   Objective    BP 113/76   Pulse 69   Ht 5\' 6"  (1.676 m)   Wt 217 lb (98.4 kg)   LMP 11/08/2023 (Exact Date)   SpO2 100%   BMI 35.02 kg/m  {Insert last BP/Wt (optional):23777}{See vitals history (optional):1}   Physical Exam   No results found for any visits on 11/29/23.      Assessment and Plan             No orders of the defined types were placed in this encounter.   Return in about 4 weeks (around 12/27/2023) for chronic disease f/u.   The patient was advised to call back or seek an in-person evaluation if the symptoms worsen or if the condition fails to improve as anticipated.  I discussed the assessment and treatment plan with the patient. The patient was provided an opportunity to ask questions and all were answered. The patient agreed with the plan and demonstrated an understanding of the instructions.  I, Debera Lat, PA-C have reviewed all documentation for this visit. The documentation on 11/29/2023  for the exam, diagnosis, procedures, and orders are all accurate and complete.  Debera Lat, [facility], MMS [facility] [phone] (phone) [phone] (fax)  [facility]

## 2023-12-01 DIAGNOSIS — F339 Major depressive disorder, recurrent, unspecified: Secondary | ICD-10-CM | POA: Insufficient documentation

## 2023-12-10 ENCOUNTER — Ambulatory Visit: Payer: Commercial Managed Care - PPO | Admitting: Dietician

## 2023-12-21 ENCOUNTER — Other Ambulatory Visit: Payer: Self-pay | Admitting: Physician Assistant

## 2023-12-21 DIAGNOSIS — F419 Anxiety disorder, unspecified: Secondary | ICD-10-CM

## 2023-12-23 NOTE — Telephone Encounter (Signed)
 Requested Prescriptions  Pending Prescriptions Disp Refills   sertraline (ZOLOFT) 25 MG tablet [Pharmacy Med Name: SERTRALINE HCL 25 MG TABLET] 90 tablet 0    Sig: TAKE 1 TABLET (25 MG TOTAL) BY MOUTH DAILY.     Psychiatry:  Antidepressants - SSRI - sertraline Passed - 12/23/2023  2:56 PM      Passed - AST in normal range and within 360 days    AST  Date Value Ref Range Status  11/14/2023 32 15 - 41 U/L Final         Passed - ALT in normal range and within 360 days    ALT  Date Value Ref Range Status  11/14/2023 32 0 - 44 U/L Final         Passed - Completed PHQ-2 or PHQ-9 in the last 360 days      Passed - Valid encounter within last 6 months    Recent Outpatient Visits           2 months ago Subacute cough   [place] [facility] [place], [place], PA-C   7 months ago Left-sided back pain, unspecified back location, unspecified chronicity   [facility] [place], [place], PA-C   8 months ago Other chest pain   [facility] [place], [place], PA-C   10 months ago Anxiety   [place] [facility] [place], [place], [place]   11 months ago Anxiety   [facility] [place], [place], [place]

## 2023-12-27 ENCOUNTER — Ambulatory Visit: Payer: Commercial Managed Care - PPO | Admitting: Physician Assistant

## 2023-12-27 ENCOUNTER — Encounter: Payer: Self-pay | Admitting: Dietician

## 2023-12-27 ENCOUNTER — Encounter: Payer: Self-pay | Admitting: Physician Assistant

## 2023-12-27 ENCOUNTER — Encounter: Attending: Physician Assistant | Admitting: Dietician

## 2023-12-27 VITALS — BP 106/70 | HR 76 | Temp 98.1°F | Resp 16 | Ht 66.0 in | Wt 215.4 lb

## 2023-12-27 VITALS — Ht 66.0 in | Wt 216.5 lb

## 2023-12-27 DIAGNOSIS — R001 Bradycardia, unspecified: Secondary | ICD-10-CM

## 2023-12-27 DIAGNOSIS — E66811 Obesity, class 1: Secondary | ICD-10-CM | POA: Insufficient documentation

## 2023-12-27 DIAGNOSIS — I469 Cardiac arrest, cause unspecified: Secondary | ICD-10-CM

## 2023-12-27 DIAGNOSIS — F339 Major depressive disorder, recurrent, unspecified: Secondary | ICD-10-CM | POA: Diagnosis not present

## 2023-12-27 DIAGNOSIS — Z9889 Other specified postprocedural states: Secondary | ICD-10-CM

## 2023-12-27 NOTE — Progress Notes (Signed)
 Medical Nutrition Therapy  Appointment Start time:  [phone]  Appointment End time:  0850  Primary concerns today: Weight Loss  Referral diagnosis: E66.9 - Obesity Preferred learning style: No preference indicated Learning readiness: Change in progress   NUTRITION ASSESSMENT   Anthropometrics  Ht: 66" Wt: 216.5 lbs BMI: 34.88 kg/m2 Wt Change: +0.5 lbs in 6 weeks Goal Weight: 165 lbs. Weight history: heaviest of 250 lbs. (2019), lowest of 185 lbs (2022)   Clinical Medical Hx: Obesity, Cholecystectomy Medications: Gabapentin, Citalopram. Meloxicam, Methylprednisolone  Labs: N/A Notable Signs/Symptoms: N/A   Lifestyle & Dietary Hx Pt reports stopping all medications except switching to Sertraline from Citalopram, states they are feeling better, has started using their Peleton again (cardio, 2 days a week 15-20 minutes) and has been tracking food on their LoseIt app again, still not consistently tracking daily but getting back into it. Pt reports continuing heel pain, trying to wear more comfortable shoes to work. Pt reports starting to eat Built Puff protein bars now in pace of Quest bars, either for breakfast or a midday snack, states they find them to be a much better option. Pt reports spouse has started taking Semaglutide and son is wanting to be more active and healthier, pt states this is motivating for them and is enjoying the cooperative effort.   Estimated daily fluid intake: 64 oz Supplements: Ginko boloba, B complex, O3 FAs, Glucosamine Sleep: Improving Stress / self-care: Mild Current average weekly physical activity: 11,000 - 13,00 steps daily, walking dog daily, NEW: Peleton 2x a week for 15-20 minutes   24-Hr Dietary Recall First Meal: Banana, protein shake w/ PB2, [place] balanced break Snack:  Second Meal: Chicken ceasar salad, low fat greek yogurt (Light n' Fit) Snack: Apple Third Meal: 2 Enchiladas, 90 calorie healthy choice ice cream bar Snack:   Beverages: Water   NUTRITION DIAGNOSIS  NB-1.1 Food and nutrition-related knowledge deficit As related to obesity.  As evidenced by BMI of 33.70 kg/m2.   NUTRITION INTERVENTION  Nutrition education (E-1) on the following topics:  Educated patient on the two components of energy balance: Energy in (calories), and energy out (activity). Explain the role of negative energy balance in weight loss. Discussed options with patient to achieve a negative energy balance and how to best control energy in and energy out to accommodate their lifestyle. Educated patient on the balanced plate eating model. Recommended lunch and dinner be 1/2 non-starchy vegetables, 1/4 starches, and 1/4 protein. Recommended breakfast be a balance of starch and protein with a piece of fruit. Discussed with patient the importance of working towards hitting the proportions of the balanced plate consistently. Counseled patient on ways to begin recognizing each of the food groups from the balanced plate in their own meals, and how close they are to fitting the recommended proportions of the balanced plate. Educated patient on the nutritional value of each food group on the balanced plate model. Counseled patient on beginning to rebuild their trust in themselves to make the right food choices for their health. Educated patient on mindful eating, including listening to their body's hunger and satiety cues, as well as eating slowly and allowing meals to be more of a sensory experience. Counseled patient on allowing themselves to be present in their emotions when they consider emotional eating. Advised patient to evaluate whether the impulse to eat is hunger based, or emotionally driven.   Handouts Provided Include  Balanced Plate  Learning Style & Readiness for Change Teaching method utilized: Visual &  Auditory  Demonstrated degree of understanding via: Teach Back  Barriers to learning/adherence to lifestyle change: None  Goals  Established by Pt Begin to incorporate resistance exercises 2 days a week for 15-20 minutes. Use any combination of the resistance bands and free weights. Keep working on being more consistent tracking your food choices on your [employer]. Take this opportunity with your wife and son to keep each other motivated and accountable on your shared health goals! Weigh yourself once a week on the same day around the same time. Look for 1-2 lbs. of weight loss in a week.   MONITORING & EVALUATION Dietary intake, weekly physical activity, and weight change in 6-8 weeks.  Next Steps  Patient is to follow up with RD.

## 2023-12-27 NOTE — Progress Notes (Signed)
 Established patient visit  Patient: Christine Banks   DOB: July 16, 1986   38 y.o. Female  MRN: [id number] Visit Date: 12/27/2023  Today's healthcare provider: Debera Lat, PA-C   Chief Complaint  Patient presents with   Follow-up    F/u wt loss meds wants to discuss and other concerns   Subjective     Discussed the use of AI scribe software for clinical note transcription with the patient, who gave verbal consent to proceed.  History of Present Illness The patient, with a history of depression, anxiety, cardiac issues, and multiple sclerosis, presents with chest discomfort and tremors. She has been taking sertraline for her mental health issues and has noticed some improvement. However, she has also experienced a "heart flutter" which she attributes to the medication.  The patient has also been dealing with a suspected rotator cuff injury, for which she has an upcoming MRI scheduled.  In terms of her cardiac health, the patient has recently completed a period of cardiac monitoring. She has not yet received the results of this monitoring but is scheduled to follow up with her cardiologist in May.  The patient is also actively working on weight loss through diet and exercise. She has been seeing a dietitian and has been using a weight loss app for several months. She has noticed some weight loss but is aware of the potential risks of rapid weight loss.     12/27/2023    1:51 PM 11/29/2023    8:31 AM 10/08/2023    3:10 PM  Depression screen PHQ 2/9  Decreased Interest 1 1 1   Down, Depressed, Hopeless 0 0 1  PHQ - 2 Score 1 1 2   Altered sleeping 2 2 1   Tired, decreased energy 2 2 1   Change in appetite 1 3 1   Feeling bad or failure about yourself  0 1 0  Trouble concentrating 1 1 1   Moving slowly or fidgety/restless 0 0 0  Suicidal thoughts 0 0 0  PHQ-9 Score 7 10 6   Difficult doing work/chores Not difficult at all Not difficult at all       12/27/2023    1:51 PM 11/29/2023    8:31  AM 10/08/2023    3:12 PM 04/09/2023    9:35 AM  GAD 7 : Generalized Anxiety Score  Nervous, Anxious, on Edge 1 2 1 2   Control/stop worrying 1 2 0 1  Worry too much - different things 1 2 1 3   Trouble relaxing 1 1 0 1  Restless 1 0 0 0  Easily annoyed or irritable 1 1 0 2  Afraid - awful might happen 0 1 0 0  Total GAD 7 Score 6 9 2 9   Anxiety Difficulty Somewhat difficult Somewhat difficult  Not difficult at all    Medications: Outpatient Medications Prior to Visit  Medication Sig   acetaminophen (TYLENOL) 500 MG tablet Take 1,000 mg by mouth every 6 (six) hours as needed for mild pain (pain score 1-3), moderate pain (pain score 4-6) or headache (Cramps).   cyclobenzaprine (FLEXERIL) 5 MG tablet Take 1 tablet (5 mg total) by mouth 3 (three) times daily as needed for muscle spasms. (Patient not taking: Reported on 11/29/2023)   Dapsone 5 % topical gel Apply 1 Application topically daily as needed (Break-out on face).   Glucosamine-Chondroit-Vit C-Mn (GLUCOSAMINE 1500 COMPLEX PO) Take 1,500 mg by mouth daily. (Patient not taking: Reported on 11/29/2023)   ibuprofen (ADVIL) 200 MG tablet Take 800 mg by mouth every 6 (  six) hours as needed for moderate pain (pain score 4-6) or mild pain (pain score 1-3).   ipratropium (ATROVENT) 0.03 % nasal spray Place 2 sprays into both nostrils daily as needed for up to 3 days for rhinitis.   sertraline (ZOLOFT) 25 MG tablet TAKE 1 TABLET (25 MG TOTAL) BY MOUTH DAILY.   SUPER B COMPLEX/C PO Take 1 tablet by mouth daily. (Patient not taking: Reported on 11/29/2023)   tretinoin (RETIN-A) 0.05 % cream Apply 1 application  topically 3 (three) times a week.   No facility-administered medications prior to visit.    Review of Systems All negative Except see HPI       Objective    BP 106/70   Pulse 76   Temp 98.1 F (36.7 C) (Oral)   Resp 16   Ht 5\' 6"  (1.676 m)   Wt 215 lb 6.4 oz (97.7 kg)   SpO2 100%   BMI 34.77 kg/m     Physical Exam Vitals  reviewed.  Constitutional:      General: She is not in acute distress.    Appearance: Normal appearance. She is well-developed. She is not diaphoretic.  HENT:     Head: Normocephalic and atraumatic.  Eyes:     General: No scleral icterus.    Conjunctiva/sclera: Conjunctivae normal.  Neck:     Thyroid: No thyromegaly.  Cardiovascular:     Rate and Rhythm: Normal rate and regular rhythm.     Pulses: Normal pulses.     Heart sounds: Normal heart sounds. No murmur heard. Pulmonary:     Effort: Pulmonary effort is normal. No respiratory distress.     Breath sounds: Normal breath sounds. No wheezing, rhonchi or rales.  Musculoskeletal:     Cervical back: Neck supple.     Right lower leg: No edema.     Left lower leg: No edema.  Lymphadenopathy:     Cervical: No cervical adenopathy.  Skin:    General: Skin is warm and dry.     Findings: No rash.  Neurological:     Mental Status: She is alert and oriented to person, place, and time. Mental status is at baseline.  Psychiatric:        Mood and Affect: Mood normal.        Behavior: Behavior normal.      No results found for any visits on 12/27/23.      Assessment & Plan Depression and Anxiety    12/27/2023    1:51 PM 11/29/2023    8:31 AM 10/08/2023    3:10 PM  PHQ9 SCORE ONLY  PHQ-9 Total Score 7 10 6    GAD7 score - 6  Sertraline restarted at half a tablet daily without adverse effects. - Continue sertraline at half a tablet daily. Relaxation techniques are advised Will follow-up  Cardiac arrest S/p Cardiac Monitoring Follow-up Completed cardiac monitoring and catheterization with no concerning results. MRI scheduled for April 30th. - Ensure MRI is completed on April 30th. - Follow up with Dr. Allena Katz in early May for cardiac evaluation. Will follow-up  S/P carpal tunnel release Doing well Will monitor  Obesity/Weight Management Actively managing weight through diet, exercise, and dietitian sessions. Advised  against rapid weight loss due to health risks. Preparing for insurance coverage for weight management interventions. - Continue diet and exercise regimen. - Attend dietitian sessions. - Maintain exercise at 150 minutes per week. - Prepare documentation for insurance coverage for weight management interventions.  Breast Cancer Screening Family history  of breast cancer. Advised to monitor for structural changes and report any unusual findings. Painful lymph nodes likely related to menstrual cycle. - Monitor for any structural changes in the breasts. - Report any persistent or unusual findings.   No orders of the defined types were placed in this encounter.   Return in about 12 weeks (around 03/20/2024) for chronic disease f/u.   The patient was advised to call back or seek an in-person evaluation if the symptoms worsen or if the condition fails to improve as anticipated.  I discussed the assessment and treatment plan with the patient. The patient was provided an opportunity to ask questions and all were answered. The patient agreed with the plan and demonstrated an understanding of the instructions.  I, Debera Lat, PA-C have reviewed all documentation for this visit. The documentation on 12/27/2023  for the exam, diagnosis, procedures, and orders are all accurate and complete.  Debera Lat, [facility], MMS [facility] [phone] (phone) [phone] (fax)  [facility]

## 2023-12-27 NOTE — Patient Instructions (Addendum)
 Begin to incorporate resistance exercises 2 days a week for 15-20 minutes. Use any combination of the resistance bands and free weights.  Keep working on being more consistent tracking your food choices on your [employer].  Take this opportunity with your wife and son to keep each other motivated and accountable on your shared health goals!  Weigh yourself once a week on the same day around the same time. Look for 1-2 lbs. of weight loss in a week.

## 2024-02-20 ENCOUNTER — Ambulatory Visit: Admitting: Dietician

## 2024-03-10 ENCOUNTER — Ambulatory Visit (INDEPENDENT_AMBULATORY_CARE_PROVIDER_SITE_OTHER): Admitting: Physician Assistant

## 2024-03-10 ENCOUNTER — Encounter: Payer: Self-pay | Admitting: Physician Assistant

## 2024-03-10 VITALS — BP 114/75 | HR 65 | Resp 16 | Ht 66.0 in | Wt 228.0 lb

## 2024-03-10 DIAGNOSIS — E66811 Obesity, class 1: Secondary | ICD-10-CM

## 2024-03-10 DIAGNOSIS — E049 Nontoxic goiter, unspecified: Secondary | ICD-10-CM | POA: Diagnosis not present

## 2024-03-10 DIAGNOSIS — L819 Disorder of pigmentation, unspecified: Secondary | ICD-10-CM | POA: Diagnosis not present

## 2024-03-10 DIAGNOSIS — L708 Other acne: Secondary | ICD-10-CM | POA: Diagnosis not present

## 2024-03-10 MED ORDER — SEMAGLUTIDE-WEIGHT MANAGEMENT 0.25 MG/0.5ML ~~LOC~~ SOAJ: 0.2500 mg | SUBCUTANEOUS | 1 refills | Status: AC

## 2024-03-10 MED ORDER — SEMAGLUTIDE-WEIGHT MANAGEMENT 0.5 MG/0.5ML ~~LOC~~ SOAJ: 0.5000 mg | SUBCUTANEOUS | 1 refills | Status: AC

## 2024-03-10 MED ORDER — TRETINOIN 0.05 % EX CREA: 1.0000 | TOPICAL_CREAM | CUTANEOUS | 3 refills | Status: AC

## 2024-03-10 NOTE — Progress Notes (Signed)
 Established patient visit  Patient: Christine Banks   DOB: 1986/08/29   38 y.o. Female  MRN: [id number] Visit Date: 03/10/2024  Today's healthcare provider: Blane Bunting, PA-C   Chief Complaint  Patient presents with   Follow-up    F/u on wt loss meds no other concerns   Subjective     HPI     Follow-up    Additional comments: F/u on wt loss meds no other concerns      Last edited by Estill Hemming, CMA on 03/10/2024  1:29 PM.       Discussed the use of AI scribe software for clinical note transcription with the patient, who gave verbal consent to proceed.  History of Present Illness Christine Banks is a 38 year old female who presents for follow-up regarding weight management and medication options.  Deloria engages in approximately 150 minutes of exercise per week and attends the gym three to four times weekly with her wife. Despite these efforts, she has experienced weight gain. She has not been on any weight loss medication, but her cardiologist has approved starting a GLP-1 medication.  She is currently taking sertraline  and has attended four dietitian sessions to aid in weight management.  There is no cold or heat intolerance. Her mother had hyperthyroidism and her grandmother had hypothyroidism, both managed with medication. There is no family history of thyroid  cancer.  She uses tretinoin 0.05% cream for acne, applying it two to three times a week, and has completed approximately eight laser treatment sessions for acne scarring.       03/10/2024    1:33 PM 12/27/2023    1:51 PM 11/29/2023    8:31 AM  Depression screen PHQ 2/9  Decreased Interest 1 1 1   Down, Depressed, Hopeless 1 0 0  PHQ - 2 Score 2 1 1   Altered sleeping 2 2 2   Tired, decreased energy 1 2 2   Change in appetite 2 1 3   Feeling bad or failure about yourself  0 0 1  Trouble concentrating 0 1 1  Moving slowly or fidgety/restless 0 0 0  Suicidal thoughts 0 0 0  PHQ-9 Score 7 7 10   Difficult doing  work/chores Not difficult at all Not difficult at all Not difficult at all      03/10/2024    1:33 PM 12/27/2023    1:51 PM 11/29/2023    8:31 AM 10/08/2023    3:12 PM  GAD 7 : Generalized Anxiety Score  Nervous, Anxious, on Edge 1 1 2 1   Control/stop worrying 2 1 2  0  Worry too much - different things 2 1 2 1   Trouble relaxing 1 1 1  0  Restless 0 1 0 0  Easily annoyed or irritable 0 1 1 0  Afraid - awful might happen 0 0 1 0  Total GAD 7 Score 6 6 9 2   Anxiety Difficulty Somewhat difficult Somewhat difficult Somewhat difficult     Medications: Outpatient Medications Prior to Visit  Medication Sig   acetaminophen  (TYLENOL ) 500 MG tablet Take 1,000 mg by mouth every 6 (six) hours as needed for mild pain (pain score 1-3), moderate pain (pain score 4-6) or headache (Cramps).   Dapsone 5 % topical gel Apply 1 Application topically daily as needed (Break-out on face).   ibuprofen  (ADVIL ) 200 MG tablet Take 800 mg by mouth every 6 (six) hours as needed for moderate pain (pain score 4-6) or mild pain (pain score 1-3).   sertraline  (ZOLOFT ) 25 MG  tablet TAKE 1 TABLET (25 MG TOTAL) BY MOUTH DAILY.   [DISCONTINUED] tretinoin (RETIN-A) 0.05 % cream Apply 1 application  topically 3 (three) times a week.   No facility-administered medications prior to visit.    Review of Systems All negative Except see HPI       Objective    BP 114/75 (BP Location: Right Arm, Patient Position: Sitting)   Pulse 65   Resp 16   Ht 5\' 6"  (1.676 m)   Wt 228 lb (103.4 kg)   SpO2 99%   BMI 36.80 kg/m     Physical Exam Vitals reviewed.  Constitutional:      General: She is not in acute distress.    Appearance: Normal appearance. She is well-developed. She is obese. She is not diaphoretic.  HENT:     Head: Normocephalic and atraumatic.  Eyes:     General: No scleral icterus.    Conjunctiva/sclera: Conjunctivae normal.  Neck:     Thyroid : No thyromegaly.  Cardiovascular:     Rate and Rhythm:  Normal rate and regular rhythm.     Pulses: Normal pulses.     Heart sounds: Normal heart sounds. No murmur heard. Pulmonary:     Effort: Pulmonary effort is normal. No respiratory distress.     Breath sounds: Normal breath sounds. No wheezing, rhonchi or rales.  Musculoskeletal:     Cervical back: Neck supple.     Right lower leg: No edema.     Left lower leg: No edema.  Lymphadenopathy:     Cervical: No cervical adenopathy.  Skin:    General: Skin is warm and dry.     Findings: No rash.  Neurological:     Mental Status: She is alert and oriented to person, place, and time. Mental status is at baseline.  Psychiatric:        Mood and Affect: Mood normal.        Behavior: Behavior normal.      No results found for any visits on 03/10/24.      Assessment and Plan Assessment & Plan Obesity Weight management is critical due to cardiac arrest history. Cardiologist approved GLP-1 agonists. Wegovy  initiation at 0.25 mg to assess tolerance and effectiveness. Potential side effects include nausea, vomiting, diarrhea, rapid weight loss, pancreatitis, gallbladder stones. Low dose to prevent rapid weight loss complications. Insurance approval expected. - Prescribe Wegovy  0.25 mg. - Monitor weight loss and side effects. - Order thyroid  ultrasound before starting Wegovy .  Thyroid  Health Family history of thyroid  disorders, no cancer. Thyroid  enlargement without nodules. Normal TSH levels previously. Ultrasound to rule out issues before Wegovy , as thyroid  cancer contraindicates GLP-1 agonists. - Order thyroid  ultrasound.  Acne/postinflammatory pigmentary changes Tretinoin 0.05% cream effectively manages acne and pigmentation. Sun protection emphasized. - Prescribe tretinoin 0.05% cream with refills. - Advise on sun protection while using tretinoin.  General Health Maintenance Regular exercise and dietary changes with dietitian. Gym attendance with wife for overall health improvement. -  Continue regular exercise and dietary modifications.    Orders Placed This Encounter  Procedures   US  THYROID     Standing Status:   Future    Expiration Date:   03/10/2025    Reason for Exam (SYMPTOM  OR DIAGNOSIS REQUIRED):   goiter    Preferred imaging location?:   [facility]    Return in about 6 weeks (around 04/21/2024) for chronic disease f/u.   The patient was advised to call back or seek an in-person evaluation if the  symptoms worsen or if the condition fails to improve as anticipated.  I discussed the assessment and treatment plan with the patient. The patient was provided an opportunity to ask questions and all were answered. The patient agreed with the plan and demonstrated an understanding of the instructions.  I, Vallie Teters, PA-C have reviewed all documentation for this visit. The documentation on 03/10/2024  for the exam, diagnosis, procedures, and orders are all accurate and complete.  Blane Bunting, [facility], MMS [facility] [phone] (phone) [phone] (fax)  [facility]

## 2024-03-12 ENCOUNTER — Telehealth: Payer: Self-pay

## 2024-03-12 ENCOUNTER — Other Ambulatory Visit (HOSPITAL_COMMUNITY): Payer: Self-pay

## 2024-03-12 NOTE — Telephone Encounter (Signed)
 Pharmacy Patient Advocate Encounter   Received notification from Onbase that prior authorization for Wegovy  0.25MG /0.5ML auto-injectors  is required/requested.   Insurance verification completed.   The patient is insured through [employer] [facility] .   Per test claim: PA required; PA submitted to above mentioned insurance via CoverMyMeds Key/confirmation #/EOC [facility] Status is pending

## 2024-03-12 NOTE — Telephone Encounter (Signed)
 Pharmacy Patient Advocate Encounter   Received notification from Onbase that prior authorization for Tretinoin 0.05% cream  is required/requested.   Insurance verification completed.   The patient is insured through [employer] [facility] .   Per test claim: PA required; PA submitted to above mentioned insurance via CoverMyMeds Key/confirmation #/EOC [id number] Status is pending

## 2024-03-12 NOTE — Telephone Encounter (Signed)
 Pharmacy Patient Advocate Encounter  Received notification from [employer] [facility] that Prior Authorization for Tretinoin 0.05% cream  has been APPROVED from 03/12/24 to 10/812/25. Unable to obtain price due to refill too soon rejection, last fill date 03/12/24 next available fill date06/30/25   PA #/Case ID/Reference #: [id number]

## 2024-03-12 NOTE — Telephone Encounter (Signed)
 Pharmacy Patient Advocate Encounter  Received notification from [facility] that Prior Authorization for Wegovy  0.25MG /0.5ML auto-injectors  has been DENIED.  See denial reason below. No denial letter attached in CMM. Will attach denial letter to Media tab once received.   PA #/Case ID/Reference #: [id number]

## 2024-03-13 ENCOUNTER — Ambulatory Visit
Admission: RE | Admit: 2024-03-13 | Discharge: 2024-03-13 | Disposition: A | Discharge status: Home / Self Care | Source: Ambulatory Visit | Attending: Physician Assistant | Admitting: Physician Assistant

## 2024-03-13 ENCOUNTER — Other Ambulatory Visit (HOSPITAL_COMMUNITY): Payer: Self-pay

## 2024-03-13 DIAGNOSIS — E049 Nontoxic goiter, unspecified: Secondary | ICD-10-CM | POA: Diagnosis present

## 2024-03-13 DIAGNOSIS — E66811 Obesity, class 1: Secondary | ICD-10-CM | POA: Insufficient documentation

## 2024-03-13 NOTE — Telephone Encounter (Signed)
 Pharmacy Patient Advocate Encounter  Received notification from [facility] that Prior Authorization for Wegovy  0.25MG /0.5ML auto-injectors has been APPROVED from 03/13/2024 to 10/09/2024. Ran test claim, Copay is $0.00. This test claim was processed through [facility]- copay amounts may vary at other pharmacies due to pharmacy/plan contracts, or as the patient moves through the different stages of their insurance plan.   PA #/Case ID/Reference #: Key: [id number]

## 2024-03-16 ENCOUNTER — Ambulatory Visit: Payer: Self-pay | Admitting: Physician Assistant

## 2024-03-26 ENCOUNTER — Telehealth: Admitting: Physician Assistant

## 2024-03-26 DIAGNOSIS — T7840XA Allergy, unspecified, initial encounter: Secondary | ICD-10-CM

## 2024-03-26 MED ORDER — PREDNISONE 10 MG PO TABS: ORAL_TABLET | ORAL | 0 refills | Status: AC

## 2024-03-26 NOTE — Progress Notes (Signed)
 Virtual Visit Consent   Christine Banks, you are scheduled for a virtual visit with a [facility] provider today. Just as with appointments in the office, your consent must be obtained to participate. Your consent will be active for this visit and any virtual visit you may have with one of our providers in the next 365 days. If you have a MyChart account, a copy of this consent can be sent to you electronically.  As this is a virtual visit, video technology does not allow for your provider to perform a traditional examination. This may limit your provider's ability to fully assess your condition. If your provider identifies any concerns that need to be evaluated in person or the need to arrange testing (such as labs, EKG, etc.), we will make arrangements to do so. Although advances in technology are sophisticated, we cannot ensure that it will always work on either your end or our end. If the connection with a video visit is poor, the visit may have to be switched to a telephone visit. With either a video or telephone visit, we are not always able to ensure that we have a secure connection.  By engaging in this virtual visit, you consent to the provision of healthcare and authorize for your insurance to be billed (if applicable) for the services provided during this visit. Depending on your insurance coverage, you may receive a charge related to this service.  I need to obtain your verbal consent now. Are you willing to proceed with your visit today? Christine Banks has provided verbal consent on 03/26/2024 for a virtual visit (video or telephone). Christine Banks, [place]  Date: 03/26/2024 12:50 PM   Virtual Visit via Video Note   I, Christine Banks, connected with  Christine Banks  ([id number], Sep 21, 1986) on 03/26/24 at 12:45 PM EDT by a video-enabled telemedicine application and verified that I am speaking with the correct person using two identifiers.  Location: Patient: Virtual Visit Location  Patient: Home Provider: Virtual Visit Location Provider: Home Office   I discussed the limitations of evaluation and management by telemedicine and the availability of in person appointments. The patient expressed understanding and agreed to proceed.    History of Present Illness: Christine Banks is a 38 y.o. who identifies as a female who was assigned female at birth, and is being seen today for onset yesterday afternoon of rash starting on her L arm that were quite itchy. Later in the day started noting the rash had spread to her other arm and torso. Now spread all over her body and associated with mild swelling in her lips. Continued itching. Denies SOB or difficulty swallowing. Used a new facial spray but no other changes to soaps, lotions or detergents. Feels she used the spray after she first noted the rash starting however. Has taken OTC Benadryl and applied anti-itch cream.  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   Goiter 03/10/2024   Other acne 03/10/2024   Depression, recurrent (HCC) 12/01/2023   Cardiac arrest (HCC) 11/14/2023   Left arm pain 05/07/2023   Left-sided back pain 05/07/2023   Cervicogenic headache 05/07/2023   Anxiety 01/13/2023   Palpitations 01/13/2023   Other chest pain 01/13/2023   Upper back pain 05/10/2022   Neck pain 05/10/2022   Bilateral low back pain 05/10/2022   Encounter for medical examination to establish care 02/15/2022   Obesity (BMI 30.0-34.9) 02/15/2022  Allergies: No Known Allergies Medications:  Current Outpatient Medications:    predniSONE (DELTASONE) 10 MG tablet, Take 4 tablets (40 mg total) by mouth daily with breakfast for 4 days, THEN 3 tablets (30 mg total) daily with breakfast for 4 days, THEN 2 tablets (20 mg total) daily with breakfast for 3 days, THEN 1 tablet (10 mg total) daily with breakfast for 3 days., Disp: 37 tablet, Rfl: 0   acetaminophen   (TYLENOL ) 500 MG tablet, Take 1,000 mg by mouth every 6 (six) hours as needed for mild pain (pain score 1-3), moderate pain (pain score 4-6) or headache (Cramps)., Disp: , Rfl:    Dapsone 5 % topical gel, Apply 1 Application topically daily as needed (Break-out on face)., Disp: , Rfl:    ibuprofen  (ADVIL ) 200 MG tablet, Take 800 mg by mouth every 6 (six) hours as needed for moderate pain (pain score 4-6) or mild pain (pain score 1-3)., Disp: , Rfl:    Semaglutide -Weight Management 0.25 MG/0.5ML SOAJ, Inject 0.25 mg into the skin once a week for 28 days., Disp: 2 mL, Rfl: 1   [START ON 04/08/2024] Semaglutide -Weight Management 0.5 MG/0.5ML SOAJ, Inject 0.5 mg into the skin once a week for 28 days., Disp: 2 mL, Rfl: 1   sertraline  (ZOLOFT ) 25 MG tablet, TAKE 1 TABLET (25 MG TOTAL) BY MOUTH DAILY., Disp: 90 tablet, Rfl: 0   tretinoin  (RETIN-A ) 0.05 % cream, Apply 1 application  topically 3 (three) times a week., Disp: 45 g, Rfl: 3  Observations/Objective: Patient is well-developed, well-nourished in no acute distress.  Resting comfortably  at home.  Head is normocephalic, atraumatic.  No labored breathing.  Speech is clear and coherent with logical content.  Patient is alert and oriented at baseline.  Urticarial rash noted of extremities and torso. No facial rash noted but some residual swelling noted of upper lip.  Assessment and Plan: 1. Allergic reaction, initial encounter (Primary) - predniSONE (DELTASONE) 10 MG tablet; Take 4 tablets (40 mg total) by mouth daily with breakfast for 4 days, THEN 3 tablets (30 mg total) daily with breakfast for 4 days, THEN 2 tablets (20 mg total) daily with breakfast for 3 days, THEN 1 tablet (10 mg total) daily with breakfast for 3 days.  Dispense: 37 tablet; Refill: 0  Unclear trigger. She is to keep a symptom journal. Supportive measures and OTC medications reviewed. Start prednisone pack to take as directed. Strict ER precautions reviewed. Giving unknown  trigger, if any recurrence down the road, she will need follow-up with PCP for allergist referral.  Follow Up Instructions: I discussed the assessment and treatment plan with the patient. The patient was provided an opportunity to ask questions and all were answered. The patient agreed with the plan and demonstrated an understanding of the instructions.  A copy of instructions were sent to the patient via MyChart unless otherwise noted below.   The patient was advised to call back or seek an in-person evaluation if the symptoms worsen or if the condition fails to improve as anticipated.    Christine Velma Lunger, PA-C

## 2024-03-26 NOTE — Patient Instructions (Signed)
 Christine Banks, thank you for joining Christine Velma Lunger, PA-C for today's virtual visit.  While this provider is not your primary care provider (PCP), if your PCP is located in our provider database this encounter information will be shared with them immediately following your visit.   A [facility] MyChart account gives you access to today's visit and all your visits, tests, and labs performed at [facility]  click here if you don't have a [facility] MyChart account or go to [email or web address]  Consent: (Patient) Christine Banks provided verbal consent for this virtual visit at the beginning of the encounter.  Current Medications:  Current Outpatient Medications:    predniSONE (DELTASONE) 10 MG tablet, Take 4 tablets (40 mg total) by mouth daily with breakfast for 4 days, THEN 3 tablets (30 mg total) daily with breakfast for 4 days, THEN 2 tablets (20 mg total) daily with breakfast for 3 days, THEN 1 tablet (10 mg total) daily with breakfast for 3 days., Disp: 37 tablet, Rfl: 0   acetaminophen  (TYLENOL ) 500 MG tablet, Take 1,000 mg by mouth every 6 (six) hours as needed for mild pain (pain score 1-3), moderate pain (pain score 4-6) or headache (Cramps)., Disp: , Rfl:    Dapsone 5 % topical gel, Apply 1 Application topically daily as needed (Break-out on face)., Disp: , Rfl:    ibuprofen  (ADVIL ) 200 MG tablet, Take 800 mg by mouth every 6 (six) hours as needed for moderate pain (pain score 4-6) or mild pain (pain score 1-3)., Disp: , Rfl:    Semaglutide -Weight Management 0.25 MG/0.5ML SOAJ, Inject 0.25 mg into the skin once a week for 28 days., Disp: 2 mL, Rfl: 1   [START ON 04/08/2024] Semaglutide -Weight Management 0.5 MG/0.5ML SOAJ, Inject 0.5 mg into the skin once a week for 28 days., Disp: 2 mL, Rfl: 1   sertraline  (ZOLOFT ) 25 MG tablet, TAKE 1 TABLET (25 MG TOTAL) BY MOUTH DAILY., Disp: 90 tablet, Rfl: 0   tretinoin  (RETIN-A ) 0.05 % cream, Apply 1 application  topically 3  (three) times a week., Disp: 45 g, Rfl: 3   Medications ordered in this encounter:  Meds ordered this encounter  Medications   predniSONE (DELTASONE) 10 MG tablet    Sig: Take 4 tablets (40 mg total) by mouth daily with breakfast for 4 days, THEN 3 tablets (30 mg total) daily with breakfast for 4 days, THEN 2 tablets (20 mg total) daily with breakfast for 3 days, THEN 1 tablet (10 mg total) daily with breakfast for 3 days.    Dispense:  37 tablet    Refill:  0    Supervising Provider:   BLAISE ALEENE KIDD [[id number]]     *If you need refills on other medications prior to your next appointment, please contact your pharmacy*  Follow-Up: Call back or seek an in-person evaluation if the symptoms worsen or if the condition fails to improve as anticipated.  [facility] [phone]  Other Instructions Please keep skin clean and dry. Ok to continue OTC Benadryl and topical itch creams. Keep a symptom journal as discussed to help us  narrow down a potential trigger. Take the prednisone as directed. If you note any non-resolving, new, or worsening symptoms despite treatment, please seek an in-person evaluation ASAP.    If you have been instructed to have an in-person evaluation today at a local Urgent Care facility, please use the link below. It will take you to a list of all of our available [facility], including address, phone number and hours of operation. Please do not delay care.  [facility]  If you or a family member do not have a primary care provider, use the link below to schedule a visit and establish care. When you choose a [facility] primary care physician or advanced practice provider, you gain a long-term partner in health. Find a Primary Care Provider  Learn more about [facility]'s in-office and virtual care options:  - Get Care Now

## 2024-04-10 ENCOUNTER — Ambulatory Visit: Payer: Self-pay

## 2024-04-10 NOTE — Telephone Encounter (Signed)
 FYI Only or Action Required?: FYI only for provider.  Patient was last seen in primary care on na.  Called Nurse Triage reporting Urticaria.  Symptoms began several weeks ago.  Interventions attempted: Prescription medications: benadryl, prednisone , hydrocortisone cream.  Symptoms are: gradually worsening.  Triage Disposition: See Physician Within 24 Hours  Patient/caregiver understands and will follow disposition?: Yes    Copied from CRM [id number]. Topic: Clinical - Red Word Triage >> Apr 10, 2024  8:18 AM Vena H wrote: Red Word that prompted transfer to Nurse Triage: Pt is having what she believes is an allergic reaction to Wegovy . Started off as hives and now her lips are swollen Reason for Disposition  [1] MODERATE-SEVERE hives (e.g.,hives interfere with normal activities or work) AND [2] not improved after taking antihistamine (e.g., cetirizine , fexofenadine, or loratadine) > 24 hours  Answer Assessment - Initial Assessment Questions 1. APPEARANCE: What does the rash look like?      They look like welts and bug bites; States finished prednisone  yesterday and has been taking benadryl. 2. LOCATION: Where is the rash located?       Arms, chest, belly, legs 3. NUMBER: How many hives are there?      Multiple and has been taking benadryl 4. SIZE: How big are the hives? (e.g., inches, cm, compare to coins) Do they all look the same or do they vary in shape and size?      They all very in size, but most dime size 5. ONSET: When did the hives begin? (e.g., hours or days ago)      2 weeks ago, Wednesday or Thursday 6. ITCHING: Does it itch? If Yes, ask: How bad is the itch?  (e.g., none, mild, moderate, severe)     Yes, moderate to severe 7. RECURRENT PROBLEM: Have you had hives before? If Yes, ask: When was the last time? and What happened that time?      Yes, two weeks ao 8. TRIGGERS: Were you exposed to any new food, plant, cosmetic product or animal just  before the hives began?     wegovy  9. OTHER SYMPTOMS: Do you have any other symptoms? (e.g., fever, tongue swelling, difficulty breathing, abdomen pain)     Lip swelling 10. PREGNANCY: Is there any chance you are pregnant? When was your last menstrual period?       na  Protocols used: Hives-A-AH

## 2024-04-12 ENCOUNTER — Other Ambulatory Visit: Payer: Self-pay | Admitting: Physician Assistant

## 2024-04-12 DIAGNOSIS — F419 Anxiety disorder, unspecified: Secondary | ICD-10-CM

## 2024-04-14 NOTE — Telephone Encounter (Signed)
 Requested Prescriptions  Pending Prescriptions Disp Refills   sertraline  (ZOLOFT ) 25 MG tablet [Pharmacy Med Name: SERTRALINE  HCL 25 MG TABLET] 90 tablet 0    Sig: TAKE 1 TABLET (25 MG TOTAL) BY MOUTH DAILY.     Psychiatry:  Antidepressants - SSRI - sertraline  Passed - 04/14/2024 11:31 AM      Passed - AST in normal range and within 360 days    AST  Date Value Ref Range Status  11/14/2023 32 15 - 41 U/L Final         Passed - ALT in normal range and within 360 days    ALT  Date Value Ref Range Status  11/14/2023 32 0 - 44 U/L Final         Passed - Completed PHQ-2 or PHQ-9 in the last 360 days      Passed - Valid encounter within last 6 months    Recent Outpatient Visits           1 month ago Obesity (BMI 30.0-34.9)   [place] [facility] [place], [place], PA-C   3 months ago Depression, recurrent [facility])   [place] [facility] [place], [place], PA-C   4 months ago Anxiety   [facility] [place], [place], PA-C

## 2024-04-26 NOTE — Progress Notes (Unsigned)
 Established patient visit  Patient: Christine Banks   DOB: 1986-04-11   38 y.o. Female  MRN: [id number] Visit Date: 04/28/2024  Today's healthcare provider: Jolynn Spencer, PA-C   No chief complaint on file.  Subjective       Discussed the use of AI scribe software for clinical note transcription with the patient, who gave verbal consent to proceed.  History of Present Illness        03/10/2024    1:33 PM 12/27/2023    1:51 PM 11/29/2023    8:31 AM  Depression screen PHQ 2/9  Decreased Interest 1 1 1   Down, Depressed, Hopeless 1 0 0  PHQ - 2 Score 2 1 1   Altered sleeping 2 2 2   Tired, decreased energy 1 2 2   Change in appetite 2 1 3   Feeling bad or failure about yourself  0 0 1  Trouble concentrating 0 1 1  Moving slowly or fidgety/restless 0 0 0  Suicidal thoughts 0 0 0  PHQ-9 Score 7 7 10   Difficult doing work/chores Not difficult at all Not difficult at all Not difficult at all      03/10/2024    1:33 PM 12/27/2023    1:51 PM 11/29/2023    8:31 AM 10/08/2023    3:12 PM  GAD 7 : Generalized Anxiety Score  Nervous, Anxious, on Edge 1 1 2 1   Control/stop worrying 2 1 2  0  Worry too much - different things 2 1 2 1   Trouble relaxing 1 1 1  0  Restless 0 1 0 0  Easily annoyed or irritable 0 1 1 0  Afraid - awful might happen 0 0 1 0  Total GAD 7 Score 6 6 9 2   Anxiety Difficulty Somewhat difficult Somewhat difficult Somewhat difficult     Medications: Outpatient Medications Prior to Visit  Medication Sig  . acetaminophen  (TYLENOL ) 500 MG tablet Take 1,000 mg by mouth every 6 (six) hours as needed for mild pain (pain score 1-3), moderate pain (pain score 4-6) or headache (Cramps).  . Dapsone 5 % topical gel Apply 1 Application topically daily as needed (Break-out on face).  . ibuprofen  (ADVIL ) 200 MG tablet Take 800 mg by mouth every 6 (six) hours as needed for moderate pain (pain score 4-6) or mild pain (pain score 1-3).  . Semaglutide -Weight Management 0.5 MG/0.5ML SOAJ  Inject 0.5 mg into the skin once a week for 28 days.  . sertraline  (ZOLOFT ) 25 MG tablet TAKE 1 TABLET (25 MG TOTAL) BY MOUTH DAILY.  . tretinoin  (RETIN-A ) 0.05 % cream Apply 1 application  topically 3 (three) times a week.   No facility-administered medications prior to visit.    Review of Systems  All other systems reviewed and are negative.  All negative Except see HPI   {Insert previous labs (optional):23779} {See past labs  Heme  Chem  Endocrine  Serology  Results Review (optional):1}   Objective    There were no vitals taken for this visit. {Insert last BP/Wt (optional):23777}{See vitals history (optional):1}   Physical Exam Vitals reviewed.  Constitutional:      General: She is not in acute distress.    Appearance: Normal appearance. She is well-developed. She is not diaphoretic.  HENT:     Head: Normocephalic and atraumatic.  Eyes:     General: No scleral icterus.    Conjunctiva/sclera: Conjunctivae normal.  Neck:     Thyroid : No thyromegaly.  Cardiovascular:     Rate and Rhythm: Normal rate  and regular rhythm.     Pulses: Normal pulses.     Heart sounds: Normal heart sounds. No murmur heard. Pulmonary:     Effort: Pulmonary effort is normal. No respiratory distress.     Breath sounds: Normal breath sounds. No wheezing, rhonchi or rales.  Musculoskeletal:     Cervical back: Neck supple.     Right lower leg: No edema.     Left lower leg: No edema.  Lymphadenopathy:     Cervical: No cervical adenopathy.  Skin:    General: Skin is warm and dry.     Findings: No rash.  Neurological:     Mental Status: She is alert and oriented to person, place, and time. Mental status is at baseline.  Psychiatric:        Mood and Affect: Mood normal.        Behavior: Behavior normal.     No results found for any visits on 04/28/24.      Assessment and Plan Assessment & Plan     No orders of the defined types were placed in this encounter.   No follow-ups on  file.   The patient was advised to call back or seek an in-person evaluation if the symptoms worsen or if the condition fails to improve as anticipated.  I discussed the assessment and treatment plan with the patient. The patient was provided an opportunity to ask questions and all were answered. The patient agreed with the plan and demonstrated an understanding of the instructions.  I, Gabino Hagin, PA-C have reviewed all documentation for this visit. The documentation on 04/28/2024  for the exam, diagnosis, procedures, and orders are all accurate and complete.  Jolynn Spencer, [facility], MMS [facility] [phone] (phone) [phone] (fax)  [facility]

## 2024-04-28 ENCOUNTER — Encounter: Payer: Self-pay | Admitting: Physician Assistant

## 2024-04-28 ENCOUNTER — Ambulatory Visit (INDEPENDENT_AMBULATORY_CARE_PROVIDER_SITE_OTHER): Admitting: Physician Assistant

## 2024-04-28 VITALS — BP 110/72 | HR 63 | Ht 66.0 in | Wt 225.5 lb

## 2024-04-28 DIAGNOSIS — F419 Anxiety disorder, unspecified: Secondary | ICD-10-CM

## 2024-04-28 DIAGNOSIS — F339 Major depressive disorder, recurrent, unspecified: Secondary | ICD-10-CM | POA: Diagnosis not present

## 2024-04-28 DIAGNOSIS — M79671 Pain in right foot: Secondary | ICD-10-CM | POA: Diagnosis not present

## 2024-04-28 DIAGNOSIS — L509 Urticaria, unspecified: Secondary | ICD-10-CM | POA: Diagnosis not present

## 2024-04-28 DIAGNOSIS — G8929 Other chronic pain: Secondary | ICD-10-CM

## 2024-04-28 DIAGNOSIS — E66811 Obesity, class 1: Secondary | ICD-10-CM

## 2024-04-28 DIAGNOSIS — T783XXD Angioneurotic edema, subsequent encounter: Secondary | ICD-10-CM

## 2024-04-28 MED ORDER — HYDROXYZINE PAMOATE 25 MG PO CAPS: 25.0000 mg | ORAL_CAPSULE | Freq: Three times a day (TID) | ORAL | 0 refills | Status: DC | PRN

## 2024-05-26 ENCOUNTER — Other Ambulatory Visit: Payer: Self-pay | Admitting: Family Medicine

## 2024-05-26 ENCOUNTER — Encounter: Payer: Self-pay | Admitting: Family Medicine

## 2024-05-26 ENCOUNTER — Ambulatory Visit (INDEPENDENT_AMBULATORY_CARE_PROVIDER_SITE_OTHER): Admitting: Family Medicine

## 2024-05-26 VITALS — BP 118/85 | HR 68 | Temp 97.9°F | Ht 66.0 in | Wt 225.0 lb

## 2024-05-26 DIAGNOSIS — F339 Major depressive disorder, recurrent, unspecified: Secondary | ICD-10-CM | POA: Diagnosis not present

## 2024-05-26 DIAGNOSIS — I469 Cardiac arrest, cause unspecified: Secondary | ICD-10-CM | POA: Diagnosis not present

## 2024-05-26 DIAGNOSIS — E66812 Obesity, class 2: Secondary | ICD-10-CM | POA: Insufficient documentation

## 2024-05-26 DIAGNOSIS — F419 Anxiety disorder, unspecified: Secondary | ICD-10-CM

## 2024-05-26 DIAGNOSIS — Z6836 Body mass index (BMI) 36.0-36.9, adult: Secondary | ICD-10-CM

## 2024-05-26 MED ORDER — TIRZEPATIDE-WEIGHT MANAGEMENT 2.5 MG/0.5ML ~~LOC~~ SOAJ: 2.5000 mg | SUBCUTANEOUS | 0 refills | Status: DC

## 2024-05-26 MED ORDER — TIRZEPATIDE-WEIGHT MANAGEMENT 5 MG/0.5ML ~~LOC~~ SOAJ: 5.0000 mg | SUBCUTANEOUS | 0 refills | Status: DC

## 2024-05-26 NOTE — Assessment & Plan Note (Signed)
 Experienced an intraoperative cardiac arrest during previous surgery, resolved without identified cardiac issues. Extensive cardiac workup, including heart catheterization and MRI, showed no abnormalities. Cardiologists have given her a clean bill of health, but the cause remains unknown. - Reassure her about cardiac health based on cardiologist evaluations - Recommend therapy for trauma, specifically brain spotting or EMDR

## 2024-05-26 NOTE — Progress Notes (Signed)
 Established patient visit   Patient: Christine Banks   DOB: [birth year]   38 y.o. Female  MRN: [id number] Visit Date: 05/26/2024  Today's healthcare provider: Jon Eva, MD   Chief Complaint  Patient presents with   Follow-up    Patient is here to discuss weight loss options was currently on Wegovy  for 4 weeks and she kept having the hives so she feels as though she was allergic to it.     Subjective    HPI HPI     Follow-up    Additional comments: Patient is here to discuss weight loss options was currently on Wegovy  for 4 weeks and she kept having the hives so she feels as though she was allergic to it.        Last edited by Terrel Powell CROME, CMA on 05/26/2024 10:27 AM.       Discussed the use of AI scribe software for clinical note transcription with the patient, who gave verbal consent to proceed.  History of Present Illness   Christine Banks is a 38 year old female who presents with concerns following a cardiac arrest during a previous surgery.  In February, she experienced a cardiac arrest during carpal tunnel surgery on her right wrist. Post-operatively, she became bradycardic and required resuscitation. She was admitted to the ICU and underwent a heart catheterization, which showed no abnormalities. Subsequent evaluations, including a heart MRI and heart monitoring, revealed no cardiac issues.  She experiences shooting pains down her left wrist, which are worsening. She uses cortisone shots for pain management but is concerned about undergoing another surgery due to her previous experience. 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  She developed hives and a sore throat after starting Wegovy  in June, which worsened with pool water exposure. She discontinued Wegovy  six to seven weeks ago and has not been tested for medication allergies.  She takes Zoloft  25 mg daily for anxiety, which is more prominent than depression. She  engages in lifestyle modifications, including diet and exercise, and uses the [facility] program and a dietitian for weight management.       Medications: Outpatient Medications Prior to Visit  Medication Sig   acetaminophen  (TYLENOL ) 500 MG tablet Take 1,000 mg by mouth every 6 (six) hours as needed for mild pain (pain score 1-3), moderate pain (pain score 4-6) or headache (Cramps).   cetirizine  (ZYRTEC ) 10 MG tablet Take 1 tablet by mouth daily.   Dapsone 5 % topical gel Apply 1 Application topically daily as needed (Break-out on face).   ibuprofen  (ADVIL ) 200 MG tablet Take 800 mg by mouth every 6 (six) hours as needed for moderate pain (pain score 4-6) or mild pain (pain score 1-3).   sertraline  (ZOLOFT ) 25 MG tablet TAKE 1 TABLET (25 MG TOTAL) BY MOUTH DAILY.   tretinoin  (RETIN-A ) 0.05 % cream Apply 1 application  topically 3 (three) times a week.   [DISCONTINUED] famotidine (PEPCID) 20 MG tablet Take 20 mg by mouth daily. (Patient not taking: Reported on 05/26/2024)   [DISCONTINUED] hydrOXYzine  (VISTARIL ) 25 MG capsule Take 1 capsule (25 mg total) by mouth every 8 (eight) hours as needed. (Patient not taking: Reported on 05/26/2024)   [DISCONTINUED] meloxicam (MOBIC) 15 MG tablet Take 15 mg by mouth. (Patient not taking: Reported on 04/28/2024)   No facility-administered medications prior to visit.    Review of Systems     Objective    BP 118/85 (BP  Location: Left Arm, Patient Position: Sitting, Cuff Size: Normal)   Pulse 68   Temp 97.9 F (36.6 C) (Oral)   Ht 5' 6 (1.676 m)   Wt 225 lb (102.1 kg)   LMP 04/07/2024   SpO2 98%   BMI 36.32 kg/m    Physical Exam Vitals reviewed.  Constitutional:      General: She is not in acute distress.    Appearance: Normal appearance. She is well-developed. She is not diaphoretic.  HENT:     Head: Normocephalic and atraumatic.  Eyes:     General: No scleral icterus.    Conjunctiva/sclera: Conjunctivae normal.  Neck:     Thyroid : No  thyromegaly.  Cardiovascular:     Rate and Rhythm: Normal rate and regular rhythm.     Heart sounds: Normal heart sounds. No murmur heard. Pulmonary:     Effort: Pulmonary effort is normal. No respiratory distress.     Breath sounds: Normal breath sounds. No wheezing, rhonchi or rales.  Musculoskeletal:     Cervical back: Neck supple.     Right lower leg: No edema.     Left lower leg: No edema.  Lymphadenopathy:     Cervical: No cervical adenopathy.  Skin:    General: Skin is warm and dry.     Findings: No rash.  Neurological:     Mental Status: She is alert and oriented to person, place, and time. Mental status is at baseline.  Psychiatric:        Mood and Affect: Mood normal.        Behavior: Behavior normal.      No results found for any visits on 05/26/24.  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 She is apprehensive about surgery due to a previous intraoperative cardiac arrest.  Possible allergic reaction to GLP-1 agonist (Wegovy ) Experienced hives after starting Wegovy , but reaction may have been related to a viral illness rather than the medication. Discussed low likelihood of allergic reaction to Wegovy  and suggested trying Zepbound  instead. - Prescribe Zepbound  as an alternative to Wegovy  - Advise her to monitor for any allergic reactions with Zepbound        Return in about 2 months (around 07/26/2024) for chronic disease f/u.       Jon Eva, MD  [facility] [phone] (phone) [phone] (fax)  [facility]

## 2024-05-26 NOTE — Assessment & Plan Note (Signed)
 She is on Zoloft  25 mg for anxiety, which is effective. Anxiety likely exacerbated by previous cardiac event and uncertainty surrounding it.

## 2024-05-26 NOTE — Assessment & Plan Note (Signed)
 She discontinued Wegovy  due to a possible allergic reaction and is managing obesity with lifestyle modifications, including diet, exercise, and the [place] program for weight tracking. Discussed Zepbound  as a potential alternative with better weight loss outcomes, justified by prior cardiac arrest and possible allergic reaction to Wegovy , despite insurance preference for Wegovy . - Prescribe Zepbound  starting at 2.5 mg for one month, then increase to 5 mg for one month - Perform prior authorization for Zepbound  - Advise her to rotate injection sites - Instruct her to contact provider if issues arise [facility] - Schedule follow-up in two months to assess weight loss and side effects

## 2024-05-28 NOTE — Telephone Encounter (Signed)
 Can we send this to PA team?  She had adverse reaction (hives) to wegovy  and needs to try alternative

## 2024-05-29 NOTE — Telephone Encounter (Signed)
 See my message below re PA team

## 2024-06-02 ENCOUNTER — Other Ambulatory Visit (HOSPITAL_COMMUNITY): Payer: Self-pay

## 2024-06-02 ENCOUNTER — Telehealth: Payer: Self-pay

## 2024-06-02 DIAGNOSIS — Z6836 Body mass index (BMI) 36.0-36.9, adult: Secondary | ICD-10-CM

## 2024-06-02 NOTE — Telephone Encounter (Signed)
 Noted

## 2024-06-02 NOTE — Telephone Encounter (Signed)
 PA has been submitted, currently pending, thank you.

## 2024-06-02 NOTE — Telephone Encounter (Signed)
 Pharmacy Patient Advocate Encounter   Received notification from Physician's Office that prior authorization for Zepbound  2.5MG /0.5ML pen-injectors is required/requested.   Insurance verification completed.   The patient is insured through [facility] .   Per test claim: PA required; PA submitted to above mentioned insurance via Latent Key/confirmation #/EOC [id number] Status is pending

## 2024-06-03 NOTE — Telephone Encounter (Signed)
 PA has been approved

## 2024-06-03 NOTE — Telephone Encounter (Signed)
 Pharmacy Patient Advocate Encounter  Received notification from [facility] that Prior Authorization for Zepbound  has been APPROVED from 06/02/2024 to 01/30/2025   PA #/Case ID/Reference #: [id number]

## 2024-06-11 ENCOUNTER — Other Ambulatory Visit: Payer: Self-pay

## 2024-06-11 MED ORDER — TIRZEPATIDE-WEIGHT MANAGEMENT 5 MG/0.5ML ~~LOC~~ SOAJ
5.0000 mg | SUBCUTANEOUS | 0 refills | Status: DC
  Filled 2024-06-11 – 2024-06-15 (×2): qty 2, 28d supply, fill #0

## 2024-06-11 NOTE — Telephone Encounter (Signed)
 Copied from CRM #[id number]. Topic: Clinical - Prescription Issue >> Jun 11, 2024  1:02 PM Winona R wrote: Pt calling to [facility]'t [facility]  (ZEPBOUND ) 5 MG/0.5ML Pen in stock. Please sned to hospital as the pt was told she will do if it does not come in stock. Please contact pt when its done

## 2024-06-15 ENCOUNTER — Other Ambulatory Visit: Payer: Self-pay

## 2024-06-15 ENCOUNTER — Other Ambulatory Visit (HOSPITAL_COMMUNITY): Payer: Self-pay

## 2024-06-15 NOTE — Telephone Encounter (Signed)
 I suspect that they cover mounjaro  for diabetes, not weight loss, but we can try to send it at same dose/instructions as zepbound 

## 2024-06-15 NOTE — Telephone Encounter (Unsigned)
 Copied from CRM [id number]. Topic: Clinical - Prescription Issue >> Jun 15, 2024  3:41 PM Chasity T wrote: Reason for CRM: Patient is calling to state that her insurance sent her a letter stating that it will not cover tirzepatide  (ZEPBOUND ) 5 MG/0.5ML Pen but will cover mounjoro. She is needing a new prescription if Dr Myrla is okay with giving her that instead.

## 2024-06-16 NOTE — Telephone Encounter (Signed)
 I see that Zepbound  PA was approved below and her wife told me today that she was able to pick up mounjaro  already. Has she not actually gotten either of these?

## 2024-06-17 ENCOUNTER — Telehealth: Payer: Self-pay | Admitting: Pharmacy Technician

## 2024-06-17 ENCOUNTER — Other Ambulatory Visit (HOSPITAL_COMMUNITY): Payer: Self-pay

## 2024-06-17 NOTE — Telephone Encounter (Signed)
 Pharmacy Patient Advocate Encounter  Received notification from [facility] that Prior Authorization for Tretinoin  0.05% cream has been APPROVED from 06/17/2024 to 06/17/2027.   PA #/Case ID/Reference #: [id number]

## 2024-06-17 NOTE — Telephone Encounter (Signed)
 Ok to send in Wegovy  0.25mg  weekly 1 month supply 0 refills. Thanks

## 2024-06-17 NOTE — Telephone Encounter (Signed)
 Pharmacy Patient Advocate Encounter   Received notification from CoverMyMeds that prior authorization for Tretinoin  0.05% cream is due for renewal.   Insurance verification completed.   The patient is insured through [facility].  Action: PA required; PA submitted to above mentioned insurance via Latent Key/confirmation #/EOC [id number] Status is pending

## 2024-06-18 ENCOUNTER — Other Ambulatory Visit: Payer: Self-pay

## 2024-06-18 MED ORDER — WEGOVY 0.25 MG/0.5ML ~~LOC~~ SOAJ: 0.2500 mg | SUBCUTANEOUS | 0 refills | Status: DC

## 2024-07-12 ENCOUNTER — Other Ambulatory Visit: Payer: Self-pay | Admitting: Physician Assistant

## 2024-07-12 DIAGNOSIS — F419 Anxiety disorder, unspecified: Secondary | ICD-10-CM

## 2024-07-14 ENCOUNTER — Other Ambulatory Visit: Payer: Self-pay | Admitting: Surgery

## 2024-07-21 ENCOUNTER — Encounter
Admission: RE | Admit: 2024-07-21 | Discharge: 2024-07-21 | Disposition: A | Discharge status: Home / Self Care | Source: Ambulatory Visit | Attending: Surgery | Admitting: Surgery

## 2024-07-21 HISTORY — DX: Other complications of anesthesia, initial encounter: T88.59XA

## 2024-07-21 HISTORY — DX: Anemia, unspecified: D64.9

## 2024-07-21 HISTORY — DX: Depression, unspecified: F32.A

## 2024-07-21 NOTE — Progress Notes (Signed)
 Called pt to do her anesthesia interview. While going over medications, pt states she is taking Wegovy  with her last dose being Sunday 07-19-24. I informed pt that this medication is a 7 day hold and after speaking with Dorise Pereyra NP, surgery will have to be cancelled due to not holding GLP-1. Pt verbalized understanding and was appreciative that all safety precautions are being taken. I informed pt that once her surgery gets rescheduled, PAT will reach back out to her to go over surgery instructions and to get her in for labs and EKG prior to new surgery date. Pt understands to hold Wegovy  for 7 days prior to when she gets her new surgery date.  I reached out to Tiffany, surgery scheduler at Dr Poggi's office to inform her of cancellation of surgery. Tiffany will relay this message to Dr Edie. I inquired to see if they had gotten cardiac clearance on this pt since her last CTR in Feb 2025 she went into cardiac arrest with compressions, then intubation requiring an ICU stay. Tiffany says she will send this to Dr Theodoro nurse so she can get this cardiac clearance set up.

## 2024-07-23 ENCOUNTER — Encounter: Admission: RE | Payer: Self-pay | Source: Home / Self Care

## 2024-07-23 ENCOUNTER — Ambulatory Visit: Admission: RE | Admit: 2024-07-23 | Admitting: Surgery

## 2024-07-23 SURGERY — RELEASE, CARPAL TUNNEL, ENDOSCOPIC
Anesthesia: Choice | Site: Wrist | Laterality: Left

## 2024-07-27 ENCOUNTER — Ambulatory Visit: Admitting: Family Medicine

## 2024-07-27 VITALS — BP 109/87 | HR 76 | Ht 66.0 in | Wt 226.4 lb

## 2024-07-27 DIAGNOSIS — F339 Major depressive disorder, recurrent, unspecified: Secondary | ICD-10-CM | POA: Diagnosis not present

## 2024-07-27 DIAGNOSIS — L509 Urticaria, unspecified: Secondary | ICD-10-CM

## 2024-07-27 DIAGNOSIS — E66812 Obesity, class 2: Secondary | ICD-10-CM

## 2024-07-27 DIAGNOSIS — Z6836 Body mass index (BMI) 36.0-36.9, adult: Secondary | ICD-10-CM

## 2024-07-27 DIAGNOSIS — G44209 Tension-type headache, unspecified, not intractable: Secondary | ICD-10-CM

## 2024-07-27 DIAGNOSIS — F419 Anxiety disorder, unspecified: Secondary | ICD-10-CM

## 2024-07-27 MED ORDER — TIRZEPATIDE 5 MG/0.5ML ~~LOC~~ SOAJ: 5.0000 mg | SUBCUTANEOUS | 1 refills | Status: DC

## 2024-07-27 NOTE — Assessment & Plan Note (Signed)
 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 Current treatment with Zoloft , but she reports increased anxiety. External factors likely contributing to symptoms. Meditation and self-care practices, including the 'Three Good Things' exercise, are recommended to improve mindset and reduce stress. - Encourage meditation and self-care practices. - Introduce 'Three Good Things' exercise for positive mindset. - Consider increasing Zoloft  dosage if symptoms persist.

## 2024-07-27 NOTE — Assessment & Plan Note (Signed)
 Obesity management with GLP-1 agonists. Previous use of Wegovy  at 0.5 mg resulted in appetite suppression and some gastrointestinal discomfort. Insurance approved Mounjaro , which is similar to Zepbound  but with additional GIP hormone, potentially offering less nausea and more weight loss. Mounjaro  is considered a better option due to its dual hormone action, which may provide enhanced weight loss and reduced nausea compared to Wegovy  and Ozempic . - Initiate Mounjaro  at 5 mg for one month. - Monitor response to Mounjaro  and adjust dosage if necessary. - [facility]. - Document Wegovy  as an allergy.

## 2024-07-27 NOTE — Progress Notes (Signed)
 Established patient visit   Patient: Christine Banks   DOB: [birth year]   38 y.o. Female  MRN: [id number] Visit Date: 07/27/2024  Today's healthcare provider: Jon Eva, MD   Chief Complaint  Patient presents with   Medical Management of Chronic Issues   Anxiety    She reports excellent compliance with treatment. She reports excellent tolerance of treatment. She is not having side effects. She feels her anxiety is moderate and Worse since last visit. Symptoms:difficulty concentrating, fatigue, hypersomnia, palpitations, racing thoughts, sweating    Weight Check   Subjective    HPI HPI     Anxiety    Additional comments: She reports excellent compliance with treatment. She reports excellent tolerance of treatment. She is not having side effects. She feels her anxiety is moderate and Worse since last visit. Symptoms:difficulty concentrating, fatigue, hypersomnia, palpitations, racing thoughts, sweating       Last edited by Lilian Fitzpatrick, CMA on 07/27/2024  9:57 AM.       Discussed the use of AI scribe software for clinical note transcription with the patient, who gave verbal consent to proceed.  History of Present Illness   Christine Banks is a 38 year old female who presents with hives and medication management issues.  Hives are present primarily on the upper back, most prominent in the morning, described as 'welds' with significant itching, especially at night. There is no lip swelling or other progression of symptoms. Benadryl provides relief but causes excessive daytime drowsiness.  She is on Wegovy  0.5 mg for weight loss and appetite suppression, with the last dose taken yesterday. The medication effectively reduces appetite but causes gastrointestinal discomfort. Her morning routine includes a grass-fed protein shake with mushroom coffee.  Anxiety and depression are managed with Zoloft . 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 Anxiety affects her daily life, leaving her drained after work. She experiences tension headaches, described as sharp pain on the back left side of her head, associated with stress. Meditation has been helpful in managing stress.          07/27/2024    9:58 AM 05/26/2024   10:30 AM 04/28/2024    8:30 AM 03/10/2024    1:33 PM  GAD 7 : Generalized Anxiety Score  Nervous, Anxious, on Edge 2 2 2 1   Control/stop worrying 2 2 2 2   Worry too much - different things 2 2 2 2   Trouble relaxing 1 1 1 1   Restless 1 0 1 0  Easily annoyed or irritable 1 0 0 0  Afraid - awful might happen 0 0 0 0  Total GAD 7 Score 9 7 8 6   Anxiety Difficulty Somewhat difficult Somewhat difficult Somewhat difficult Somewhat difficult        07/27/2024    9:58 AM 05/26/2024   10:30 AM 04/28/2024    8:29 AM 03/10/2024    1:33 PM 12/27/2023    1:51 PM  Depression screen PHQ 2/9  Decreased Interest 2 1 1 1 1   Down, Depressed, Hopeless 2 1 1 1  0  PHQ - 2 Score 4 2 2 2 1   Altered sleeping 2 1 2 2 2   Tired, decreased energy 1 1 2 1 2   Change in appetite 1 2 2 2 1   Feeling bad or failure about yourself  1 0 2 0 0  Trouble concentrating 2 0 0 0 1  Moving slowly or fidgety/restless 0 0  0 0 0  Suicidal thoughts 0 0 0 0 0  PHQ-9 Score 11 6 10 7 7   Difficult doing work/chores Somewhat difficult Somewhat difficult Somewhat difficult Not difficult at all Not difficult at all     Medications: Outpatient Medications Prior to Visit  Medication Sig   acetaminophen  (TYLENOL ) 500 MG tablet Take 1,000 mg by mouth every 6 (six) hours as needed for mild pain (pain score 1-3), moderate pain (pain score 4-6) or headache (Cramps).   Dapsone 5 % topical gel Apply 1 Application topically daily as needed (acne).   diphenhydrAMINE (BENADRYL) 25 mg capsule Take 25 mg by mouth every 6 (six) hours as needed for itching or allergies.   hydrOXYzine  (VISTARIL ) 25 MG capsule Take 25 mg by mouth every 8  (eight) hours as needed for itching.   sertraline  (ZOLOFT ) 25 MG tablet TAKE 1 TABLET (25 MG TOTAL) BY MOUTH DAILY.   tretinoin  (RETIN-A ) 0.05 % cream Apply 1 application  topically 3 (three) times a week.   [DISCONTINUED] semaglutide -weight management (WEGOVY ) 0.5 MG/0.5ML SOAJ SQ injection Inject 0.5 mg into the skin every Sunday.   No facility-administered medications prior to visit.    Review of Systems     Objective    BP 109/87 (BP Location: Left Arm, Patient Position: Sitting, Cuff Size: Large)   Pulse 76   Ht 5' 6 (1.676 m)   Wt 226 lb 6.4 oz (102.7 kg)   SpO2 100%   BMI 36.54 kg/m    Physical Exam Vitals reviewed.  Constitutional:      General: She is not in acute distress.    Appearance: Normal appearance. She is well-developed. She is not diaphoretic.  HENT:     Head: Normocephalic and atraumatic.  Eyes:     General: No scleral icterus.    Conjunctiva/sclera: Conjunctivae normal.  Neck:     Thyroid : No thyromegaly.  Cardiovascular:     Rate and Rhythm: Normal rate and regular rhythm.     Heart sounds: Normal heart sounds. No murmur heard. Pulmonary:     Effort: Pulmonary effort is normal. No respiratory distress.     Breath sounds: Normal breath sounds. No wheezing, rhonchi or rales.  Musculoskeletal:     Cervical back: Neck supple.     Right lower leg: No edema.     Left lower leg: No edema.  Lymphadenopathy:     Cervical: No cervical adenopathy.  Skin:    General: Skin is warm and dry.     Findings: No rash.  Neurological:     Mental Status: She is alert and oriented to person, place, and time. Mental status is at baseline.  Psychiatric:        Mood and Affect: Mood normal.        Behavior: Behavior normal.      No results found for any visits on 07/27/24.  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 Current treatment with Zoloft , but she reports increased anxiety. External factors likely contributing to symptoms. Meditation and self-care practices, including the 'Three Good Things' exercise, are recommended to improve mindset and reduce stress. - Encourage meditation and self-care practices. - Introduce 'Three Good Things' exercise for positive mindset. - Consider increasing Zoloft  dosage if symptoms persist.      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 Current treatment with Zoloft , but she reports increased anxiety. External factors likely contributing to symptoms. Meditation and self-care practices, including the 'Three Good Things' exercise, are recommended to improve mindset and reduce stress. - Encourage meditation and self-care practices. - Introduce 'Three Good Things' exercise for positive mindset. - Consider increasing Zoloft  dosage if symptoms persist.      Class 2 obesity without serious comorbidity with body mass index (BMI) of 36.0 to 36.9 in adult - Primary   Obesity management with GLP-1 agonists. Previous use of Wegovy  at 0.5 mg resulted in appetite suppression and some gastrointestinal discomfort. Insurance approved Mounjaro , which is similar to Zepbound  but with additional GIP hormone, potentially offering less nausea and more weight loss. Mounjaro  is considered a better option due to its dual hormone action, which may provide enhanced weight loss and reduced nausea compared to Wegovy  and Ozempic . - Initiate Mounjaro  at 5 mg for one month. - Monitor response to Mounjaro  and adjust dosage if necessary. - [facility] - Document Wegovy  as an allergy.      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 Previous treatment with Wegovy  resulted in hives, and insurance issues with Zepbound  led to consideration of Mounjaro . Decision to switch to Medrol due to persistent hives. - Switch to Medrol for urticaria management. - Educate on using Zyrtec  instead of Benadryl for hives to avoid drowsiness.  Tension-type headache Intermittent sharp pain at the base of the skull, likely tension-type headache. Possibly related to stress and anxiety. - Advise on self-massage techniques for tension relief. - Encourage use of massage gift card for stress reduction.        Return in about 3 months (around 10/27/2024) for CPE.       Jon Eva, MD  [facility] [phone] (phone) [phone] (fax)  [facility]

## 2024-09-10 ENCOUNTER — Other Ambulatory Visit (HOSPITAL_COMMUNITY): Payer: Self-pay

## 2024-09-14 ENCOUNTER — Telehealth: Payer: Self-pay

## 2024-09-14 ENCOUNTER — Other Ambulatory Visit (HOSPITAL_COMMUNITY): Payer: Self-pay

## 2024-09-14 NOTE — Telephone Encounter (Signed)
 Pharmacy Patient Advocate Encounter   Received notification from CoverMyMeds that prior authorization for Wegovy  0.5mg /0.76ml is required/requested.   Insurance verification completed.   The patient is insured through [facility].   Patient has been changed to Mounjaro  and per the test claim the copay is $0.   PA for the Wegovy  was not submitted.

## 2024-09-17 ENCOUNTER — Other Ambulatory Visit: Payer: Self-pay | Admitting: Family Medicine

## 2024-10-17 ENCOUNTER — Other Ambulatory Visit: Payer: Self-pay | Admitting: Family Medicine

## 2024-10-17 DIAGNOSIS — F419 Anxiety disorder, unspecified: Secondary | ICD-10-CM

## 2024-10-26 ENCOUNTER — Encounter: Payer: Self-pay | Admitting: Family Medicine

## 2024-10-27 ENCOUNTER — Encounter: Admitting: Family Medicine

## 2024-10-27 ENCOUNTER — Other Ambulatory Visit: Payer: Self-pay

## 2024-10-27 MED ORDER — TIRZEPATIDE 7.5 MG/0.5ML ~~LOC~~ SOAJ: 7.5000 mg | SUBCUTANEOUS | 2 refills | Status: AC

## 2024-10-27 NOTE — Telephone Encounter (Signed)
 Ok to increase her mounjaro  to 7.5 mg weekly (1 month supply with 2 refills). Apologize for the weather and need to cancel her appt and make sure she got rescheduled.

## 2024-10-29 ENCOUNTER — Other Ambulatory Visit: Payer: Self-pay | Admitting: Physician Assistant

## 2024-10-29 DIAGNOSIS — R0981 Nasal congestion: Secondary | ICD-10-CM

## 2024-11-04 ENCOUNTER — Other Ambulatory Visit: Payer: Self-pay | Admitting: Physician Assistant

## 2024-11-04 DIAGNOSIS — R0981 Nasal congestion: Secondary | ICD-10-CM

## 2024-11-04 DIAGNOSIS — R052 Subacute cough: Secondary | ICD-10-CM

## 2024-12-03 ENCOUNTER — Encounter: Admitting: Family Medicine
